# Patient Record
Sex: Female | Born: 1945 | Race: White | Hispanic: No | Marital: Married | Smoking: Former smoker
Health system: Southern US, Community
[De-identification: ages and names within clinical notes are randomized; demographics above are authoritative.]

## PROBLEM LIST (undated history)

## (undated) DIAGNOSIS — K589 Irritable bowel syndrome without diarrhea: Secondary | ICD-10-CM

## (undated) DIAGNOSIS — N289 Disorder of kidney and ureter, unspecified: Secondary | ICD-10-CM

## (undated) DIAGNOSIS — S37009A Unspecified injury of unspecified kidney, initial encounter: Secondary | ICD-10-CM

## (undated) DIAGNOSIS — R51 Headache: Secondary | ICD-10-CM

## (undated) DIAGNOSIS — G709 Myoneural disorder, unspecified: Secondary | ICD-10-CM

## (undated) DIAGNOSIS — E785 Hyperlipidemia, unspecified: Secondary | ICD-10-CM

## (undated) DIAGNOSIS — I38 Endocarditis, valve unspecified: Secondary | ICD-10-CM

## (undated) DIAGNOSIS — R519 Headache, unspecified: Secondary | ICD-10-CM

## (undated) DIAGNOSIS — M1990 Unspecified osteoarthritis, unspecified site: Secondary | ICD-10-CM

## (undated) DIAGNOSIS — I10 Essential (primary) hypertension: Secondary | ICD-10-CM

## (undated) DIAGNOSIS — K219 Gastro-esophageal reflux disease without esophagitis: Secondary | ICD-10-CM

## (undated) DIAGNOSIS — IMO0001 Reserved for inherently not codable concepts without codable children: Secondary | ICD-10-CM

## (undated) HISTORY — PX: CHOLECYSTECTOMY: SHX55

## (undated) HISTORY — DX: Gastro-esophageal reflux disease without esophagitis: K21.9

## (undated) HISTORY — DX: Irritable bowel syndrome without diarrhea: K58.9

## (undated) HISTORY — DX: Hyperlipidemia, unspecified: E78.5

## (undated) HISTORY — DX: Essential (primary) hypertension: I10

## (undated) HISTORY — PX: TUBAL LIGATION: SHX77

---

## 2009-01-05 HISTORY — PX: JOINT REPLACEMENT: SHX530

## 2012-10-20 ENCOUNTER — Ambulatory Visit: Payer: Self-pay | Admitting: Family Medicine

## 2012-10-31 ENCOUNTER — Ambulatory Visit: Payer: Self-pay | Admitting: Family Medicine

## 2014-08-10 ENCOUNTER — Encounter: Payer: Self-pay | Admitting: Gastroenterology

## 2014-08-10 NOTE — Telephone Encounter (Signed)
error 

## 2014-09-12 ENCOUNTER — Other Ambulatory Visit: Payer: Self-pay

## 2014-09-12 DIAGNOSIS — E785 Hyperlipidemia, unspecified: Secondary | ICD-10-CM

## 2014-09-12 DIAGNOSIS — I159 Secondary hypertension, unspecified: Secondary | ICD-10-CM

## 2014-09-12 DIAGNOSIS — K589 Irritable bowel syndrome without diarrhea: Secondary | ICD-10-CM

## 2014-09-12 DIAGNOSIS — I10 Essential (primary) hypertension: Secondary | ICD-10-CM

## 2014-09-12 HISTORY — DX: Essential (primary) hypertension: I10

## 2014-09-12 HISTORY — DX: Hyperlipidemia, unspecified: E78.5

## 2014-09-12 HISTORY — DX: Irritable bowel syndrome, unspecified: K58.9

## 2014-09-13 ENCOUNTER — Ambulatory Visit (INDEPENDENT_AMBULATORY_CARE_PROVIDER_SITE_OTHER): Payer: Medicare Other | Admitting: Gastroenterology

## 2014-09-13 ENCOUNTER — Ambulatory Visit: Payer: Medicare Other | Admitting: Gastroenterology

## 2014-09-13 ENCOUNTER — Encounter: Payer: Self-pay | Admitting: Gastroenterology

## 2014-09-13 ENCOUNTER — Encounter (INDEPENDENT_AMBULATORY_CARE_PROVIDER_SITE_OTHER): Payer: Self-pay

## 2014-09-13 ENCOUNTER — Other Ambulatory Visit: Payer: Self-pay

## 2014-09-13 VITALS — BP 138/83 | HR 65 | Temp 98.0°F | Ht 63.0 in | Wt 190.0 lb

## 2014-09-13 DIAGNOSIS — K589 Irritable bowel syndrome without diarrhea: Secondary | ICD-10-CM | POA: Diagnosis not present

## 2014-09-13 DIAGNOSIS — R195 Other fecal abnormalities: Secondary | ICD-10-CM | POA: Diagnosis not present

## 2014-09-13 NOTE — Progress Notes (Signed)
Gastroenterology Consultation  Referring Provider:     No ref. provider found Primary Care Physician:  [name], MD Primary Gastroenterologist:  Dr. Allen Norris     Reason for Consultation:     Abnormal stool test        HPI:   Kristina Mitchell is a 69 y.o. y/o female referred for consultation & management of abnormal stool test by Dr. Clemmie Krill, [name], MD.  This patient comes in today with a history of a stool tests being positive. The patient's stool test was Cologuard which can be positive for both polyps and cancer. The patient states she had a test because she did not to undergo colonoscopy so she did this stool tests. Now that was positive she realizes that she needs to have a colonoscopy. The patient also reports that she has irritable bowel syndrome. The patient has chronic diarrhea with some abdominal pain and bloating. The patient states her abdominal pain recently for the last 2 months has been in the left upper quadrant when she states she has a bulging after she eats. There is no report of any unexplained weight loss. There is no report of any fevers chills nausea vomiting. The patient also denies any black stools or bloody stools.  Past Medical History  Diagnosis Date  . Hypertension 09/12/2014  . IBS (irritable bowel syndrome) 09/12/2014  . Hyperlipidemia 09/12/2014  . GERD (gastroesophageal reflux disease)     Past Surgical History  Procedure Laterality Date  . Tubal ligation    . Cholecystectomy    . Joint replacement Bilateral     Prior to Admission medications   Medication Sig Start Date End Date Taking? Authorizing Provider  amLODipine (NORVASC) 10 MG tablet  07/31/14  Yes Historical Provider, MD  benazepril (LOTENSIN) 20 MG tablet  09/10/14  Yes Historical Provider, MD  esomeprazole (NEXIUM) 20 MG capsule Take 20 mg by mouth daily at 12 noon.   Yes Historical Provider, MD  imipramine (TOFRANIL) 25 MG tablet Take 25 mg by mouth every evening. 07/10/14  Yes Historical  Provider, MD  nadolol (CORGARD) 40 MG tablet  07/25/14  Yes Historical Provider, MD  simvastatin (ZOCOR) 20 MG tablet  07/06/14  Yes Historical Provider, MD    Family History  Problem Relation Age of Onset  . Heart failure Father   . Stroke Mother   . Cancer Mother     Uterine  . Diverticulitis Brother   . Diverticulitis Father      Social History  Substance Use Topics  . Smoking status: Former Research scientist (life sciences)  . Smokeless tobacco: Never Used  . Alcohol Use: 0.0 oz/week    0 Standard drinks or equivalent per week     Comment: Occasonal    Allergies as of 09/13/2014 - Review Complete 09/13/2014  Allergen Reaction Noted  . Sulfa antibiotics Hives and Swelling 09/12/2014    Review of Systems:    All systems reviewed and negative except where noted in HPI.   Physical Exam:  BP 138/83 mmHg  Pulse 65  Temp(Src) 98 F (36.7 C) (Oral)  Ht 5\' 3"  (1.6 m)  Wt 190 lb (86.183 kg)  BMI 33.67 kg/m2 No LMP recorded. Psych:  Alert and cooperative. Normal mood and affect. General:   Alert,  Well-developed, well-nourished, pleasant and cooperative in NAD Head:  Normocephalic and atraumatic. Eyes:  Sclera clear, no icterus.   Conjunctiva pink. Ears:  Normal auditory acuity. Nose:  No deformity, discharge, or lesions. Mouth:  No  deformity or lesions,oropharynx pink & moist. Neck:  Supple; no masses or thyromegaly. Lungs:  Respirations even and unlabored.  Clear throughout to auscultation.   No wheezes, crackles, or rhonchi. No acute distress. Heart:  Regular rate and rhythm; no murmurs, clicks, rubs, or gallops. Abdomen:  Normal bowel sounds.  No bruits.  Soft, non-tender and non-distended without masses, hepatosplenomegaly or hernias noted.  No guarding or rebound tenderness.  Negative Carnett sign.   Rectal:  Deferred.  Msk:  Symmetrical without gross deformities.  Good, equal movement & strength bilaterally. Pulses:  Normal pulses noted. Extremities:  No clubbing or edema.  No  cyanosis. Neurologic:  Alert and oriented x3;  grossly normal neurologically. Skin:  Intact without significant lesions or rashes.  No jaundice. Lymph Nodes:  No significant cervical adenopathy. Psych:  Alert and cooperative. Normal mood and affect.  Imaging Studies: No results found.  Assessment and Plan:   Kristina Mitchell is a 69 y.o. y/o female who had a positive stool test with Cologuard. The patient will be set up for colonoscopy due to this positive test. The patient is asymptomatic except for her irritable bowel syndrome symptoms including diarrhea and some bloating. The patient has been explained the plan and agrees with it. I have discussed risks & benefits which include, but are not limited to, bleeding, infection, perforation & drug reaction.  The patient agrees with this plan & written consent will be obtained.      Note: This dictation was prepared with Dragon dictation along with smaller phrase technology. Any transcriptional errors that result from this process are unintentional.

## 2014-09-20 NOTE — Discharge Instructions (Signed)

## 2014-09-21 ENCOUNTER — Encounter
Admission: RE | Disposition: A | Discharge status: Home / Self Care | Payer: Self-pay | Source: Ambulatory Visit | Attending: Gastroenterology

## 2014-09-21 ENCOUNTER — Other Ambulatory Visit: Payer: Self-pay | Admitting: Gastroenterology

## 2014-09-21 ENCOUNTER — Ambulatory Visit: Payer: Medicare Other | Admitting: Anesthesiology

## 2014-09-21 ENCOUNTER — Ambulatory Visit
Admission: RE | Admit: 2014-09-21 | Discharge: 2014-09-21 | Disposition: A | Discharge status: Home / Self Care | Payer: Medicare Other | Source: Ambulatory Visit | Attending: Gastroenterology | Admitting: Gastroenterology

## 2014-09-21 DIAGNOSIS — K635 Polyp of colon: Secondary | ICD-10-CM | POA: Diagnosis not present

## 2014-09-21 DIAGNOSIS — K640 First degree hemorrhoids: Secondary | ICD-10-CM | POA: Insufficient documentation

## 2014-09-21 DIAGNOSIS — Z87891 Personal history of nicotine dependence: Secondary | ICD-10-CM | POA: Insufficient documentation

## 2014-09-21 DIAGNOSIS — M479 Spondylosis, unspecified: Secondary | ICD-10-CM | POA: Diagnosis not present

## 2014-09-21 DIAGNOSIS — K589 Irritable bowel syndrome without diarrhea: Secondary | ICD-10-CM | POA: Diagnosis not present

## 2014-09-21 DIAGNOSIS — K219 Gastro-esophageal reflux disease without esophagitis: Secondary | ICD-10-CM | POA: Insufficient documentation

## 2014-09-21 DIAGNOSIS — G709 Myoneural disorder, unspecified: Secondary | ICD-10-CM | POA: Insufficient documentation

## 2014-09-21 DIAGNOSIS — Z9851 Tubal ligation status: Secondary | ICD-10-CM | POA: Diagnosis not present

## 2014-09-21 DIAGNOSIS — I10 Essential (primary) hypertension: Secondary | ICD-10-CM | POA: Insufficient documentation

## 2014-09-21 DIAGNOSIS — D124 Benign neoplasm of descending colon: Secondary | ICD-10-CM | POA: Diagnosis not present

## 2014-09-21 DIAGNOSIS — K5730 Diverticulosis of large intestine without perforation or abscess without bleeding: Secondary | ICD-10-CM | POA: Diagnosis not present

## 2014-09-21 DIAGNOSIS — Z1211 Encounter for screening for malignant neoplasm of colon: Secondary | ICD-10-CM | POA: Insufficient documentation

## 2014-09-21 DIAGNOSIS — D125 Benign neoplasm of sigmoid colon: Secondary | ICD-10-CM | POA: Insufficient documentation

## 2014-09-21 DIAGNOSIS — E785 Hyperlipidemia, unspecified: Secondary | ICD-10-CM | POA: Diagnosis not present

## 2014-09-21 DIAGNOSIS — Z96653 Presence of artificial knee joint, bilateral: Secondary | ICD-10-CM | POA: Diagnosis not present

## 2014-09-21 DIAGNOSIS — Z792 Long term (current) use of antibiotics: Secondary | ICD-10-CM | POA: Diagnosis not present

## 2014-09-21 DIAGNOSIS — Z9049 Acquired absence of other specified parts of digestive tract: Secondary | ICD-10-CM | POA: Insufficient documentation

## 2014-09-21 DIAGNOSIS — D122 Benign neoplasm of ascending colon: Secondary | ICD-10-CM | POA: Diagnosis not present

## 2014-09-21 HISTORY — DX: Unspecified osteoarthritis, unspecified site: M19.90

## 2014-09-21 HISTORY — PX: COLONOSCOPY WITH PROPOFOL: SHX5780

## 2014-09-21 HISTORY — DX: Headache: R51

## 2014-09-21 HISTORY — DX: Unspecified injury of unspecified kidney, initial encounter: S37.009A

## 2014-09-21 HISTORY — DX: Reserved for inherently not codable concepts without codable children: IMO0001

## 2014-09-21 HISTORY — DX: Endocarditis, valve unspecified: I38

## 2014-09-21 HISTORY — DX: Headache, unspecified: R51.9

## 2014-09-21 HISTORY — PX: POLYPECTOMY: SHX5525

## 2014-09-21 HISTORY — DX: Disorder of kidney and ureter, unspecified: N28.9

## 2014-09-21 HISTORY — DX: Myoneural disorder, unspecified: G70.9

## 2014-09-21 SURGERY — COLONOSCOPY WITH PROPOFOL
Anesthesia: Monitor Anesthesia Care | Wound class: Contaminated

## 2014-09-21 MED ORDER — FENTANYL CITRATE (PF) 100 MCG/2ML IJ SOLN: 25.0000 ug | INTRAMUSCULAR | Status: DC | PRN

## 2014-09-21 MED ORDER — DEXAMETHASONE SODIUM PHOSPHATE 4 MG/ML IJ SOLN: 8.0000 mg | Freq: Once | INTRAMUSCULAR | Status: DC | PRN

## 2014-09-21 MED ORDER — LACTATED RINGERS IV SOLN
INTRAVENOUS | Status: DC
  Administered 2014-09-21: 08:00:00 via INTRAVENOUS

## 2014-09-21 MED ORDER — ACETAMINOPHEN 325 MG PO TABS: 325.0000 mg | ORAL_TABLET | ORAL | Status: DC | PRN

## 2014-09-21 MED ORDER — LIDOCAINE HCL (CARDIAC) 20 MG/ML IV SOLN
INTRAVENOUS | Status: DC | PRN
Start: 2014-09-21 — End: 2014-09-21
  Administered 2014-09-21: 30 mg via INTRAVENOUS

## 2014-09-21 MED ORDER — STERILE WATER FOR IRRIGATION IR SOLN
Status: DC | PRN
  Administered 2014-09-21: 09:00:00

## 2014-09-21 MED ORDER — ACETAMINOPHEN 160 MG/5ML PO SOLN: 325.0000 mg | ORAL | Status: DC | PRN

## 2014-09-21 MED ORDER — OXYCODONE HCL 5 MG PO TABS: 5.0000 mg | ORAL_TABLET | Freq: Once | ORAL | Status: DC | PRN

## 2014-09-21 MED ORDER — PROPOFOL 10 MG/ML IV BOLUS
INTRAVENOUS | Status: DC | PRN
Start: 2014-09-21 — End: 2014-09-21
  Administered 2014-09-21 (×2): 40 mg via INTRAVENOUS
  Administered 2014-09-21: 80 mg via INTRAVENOUS
  Administered 2014-09-21: 40 mg via INTRAVENOUS
  Administered 2014-09-21: 20 mg via INTRAVENOUS
  Administered 2014-09-21: 40 mg via INTRAVENOUS

## 2014-09-21 MED ORDER — SODIUM CHLORIDE 0.9 % IV SOLN: INTRAVENOUS | Status: DC

## 2014-09-21 MED ORDER — OXYCODONE HCL 5 MG/5ML PO SOLN: 5.0000 mg | Freq: Once | ORAL | Status: DC | PRN

## 2014-09-21 MED ORDER — LACTATED RINGERS IV SOLN: 500.0000 mL | INTRAVENOUS | Status: DC

## 2014-09-21 SURGICAL SUPPLY — 28 items
CANISTER SUCT 1200ML W/VALVE (MISCELLANEOUS) ×4 IMPLANT
FCP ESCP3.2XJMB 240X2.8X (MISCELLANEOUS)
FORCEPS BIOP RAD 4 LRG CAP 4 (CUTTING FORCEPS) ×4 IMPLANT
FORCEPS BIOP RJ4 240 W/NDL (MISCELLANEOUS)
FORCEPS ESCP3.2XJMB 240X2.8X (MISCELLANEOUS) IMPLANT
GOWN CVR UNV OPN BCK APRN NK (MISCELLANEOUS) ×4 IMPLANT
GOWN ISOL THUMB LOOP REG UNIV (MISCELLANEOUS) ×4
HEMOCLIP INSTINCT (CLIP) IMPLANT
INJECTOR VARIJECT VIN23 (MISCELLANEOUS) IMPLANT
KIT CO2 TUBING (TUBING) IMPLANT
KIT DEFENDO VALVE AND CONN (KITS) IMPLANT
KIT ENDO PROCEDURE OLY (KITS) ×4 IMPLANT
LIGATOR MULTIBAND 6SHOOTER MBL (MISCELLANEOUS) IMPLANT
MARKER SPOT ENDO TATTOO 5ML (MISCELLANEOUS) IMPLANT
PAD GROUND ADULT SPLIT (MISCELLANEOUS) IMPLANT
SNARE SHORT THROW 13M SML OVAL (MISCELLANEOUS) ×4 IMPLANT
SNARE SHORT THROW 30M LRG OVAL (MISCELLANEOUS) IMPLANT
SPOT EX ENDOSCOPIC TATTOO (MISCELLANEOUS)
SUCTION POLY TRAP 4CHAMBER (MISCELLANEOUS) IMPLANT
TRAP SUCTION POLY (MISCELLANEOUS) ×4 IMPLANT
TUBING CONN 6MMX3.1M (TUBING)
TUBING SUCTION CONN 0.25 STRL (TUBING) IMPLANT
UNDERPAD 30X60 958B10 (PK) (MISCELLANEOUS) IMPLANT
VALVE BIOPSY ENDO (VALVE) IMPLANT
VARIJECT INJECTOR VIN23 (MISCELLANEOUS)
WATER AUXILLARY (MISCELLANEOUS) IMPLANT
WATER STERILE IRR 250ML POUR (IV SOLUTION) ×4 IMPLANT
WATER STERILE IRR 500ML POUR (IV SOLUTION) IMPLANT

## 2014-09-21 NOTE — Anesthesia Preprocedure Evaluation (Addendum)
Anesthesia Evaluation  Patient identified by MRN, date of birth, ID band Patient awake    Reviewed: Allergy & Precautions, H&P , NPO status , Patient's Chart, lab work & pertinent test results, reviewed documented beta blocker date and time   Airway Mallampati: II  TM Distance: >3 FB Neck ROM: full    Dental no notable dental hx.    Pulmonary shortness of breath, former smoker,    Pulmonary exam normal breath sounds clear to auscultation       Cardiovascular Exercise Tolerance: Good hypertension, On Home Beta Blockers and On Medications  Rhythm:regular Rate:Normal     Neuro/Psych  Headaches, negative psych ROS   GI/Hepatic Neg liver ROS, GERD  Medicated,  Endo/Other  negative endocrine ROS  Renal/GU CRFRenal disease  negative genitourinary   Musculoskeletal   Abdominal   Peds  Hematology negative hematology ROS (+)   Anesthesia Other Findings   Reproductive/Obstetrics negative OB ROS                            Anesthesia Physical Anesthesia Plan  ASA: III  Anesthesia Plan: MAC   Post-op Pain Management:    Induction:   Airway Management Planned:   Additional Equipment:   Intra-op Plan:   Post-operative Plan:   Informed Consent: I have reviewed the patients History and Physical, chart, labs and discussed the procedure including the risks, benefits and alternatives for the proposed anesthesia with the patient or authorized representative who has indicated his/her understanding and acceptance.     Plan Discussed with: CRNA  Anesthesia Plan Comments:        Anesthesia Quick Evaluation

## 2014-09-21 NOTE — Op Note (Signed)
[facility] Gastroenterology Patient Name: Kristina Mitchell Procedure Date: 09/21/2014 8:17 AM MRN: [id number] Account #: [id number] Date of Birth: 1945-02-05 Admit Type: Outpatient Age: 69 Room: [facility] OR ROOM 01 Gender: Female Note Status: Finalized Procedure:         Colonoscopy Indications:       Screening for colorectal malignant neoplasm Providers:         Lucilla Lame, MD Referring MD:      Reyes Ivan, MD (Referring MD) Medicines:         Propofol per Anesthesia Complications:     No immediate complications. Procedure:         Pre-Anesthesia Assessment:                    - Prior to the procedure, a History and Physical was                     performed, and patient medications and allergies were                     reviewed. The patient's tolerance of previous anesthesia                     was also reviewed. The risks and benefits of the procedure                     and the sedation options and risks were discussed with the                     patient. All questions were answered, and informed consent                     was obtained. Prior Anticoagulants: The patient has taken                     no previous anticoagulant or antiplatelet agents. ASA                     Grade Assessment: II - A patient with mild systemic                     disease. After reviewing the risks and benefits, the                     patient was deemed in satisfactory condition to undergo                     the procedure.                    After obtaining informed consent, the colonoscope was                     passed under direct vision. Throughout the procedure, the                     patient's blood pressure, pulse, and oxygen saturations                     were monitored continuously. The Olympus CF H180AL                     colonoscope (S#: [id number]) was introduced through the anus  and advanced to the the cecum, identified by appendiceal         orifice and ileocecal valve. The colonoscopy was performed                     without difficulty. The patient tolerated the procedure                     well. The quality of the bowel preparation was good. Findings:      The perianal and digital rectal examinations were normal.      A 4 mm polyp was found in the ascending colon. The polyp was sessile.       The polyp was removed with a cold biopsy forceps. Resection and       retrieval were complete.      A 9 mm polyp was found in the descending colon. The polyp was sessile.       The polyp was removed with a cold snare. Resection and retrieval were       complete.      A 3 mm polyp was found in the sigmoid colon. The polyp was sessile. The       polyp was removed with a cold biopsy forceps. Resection and retrieval       were complete.      Many small-mouthed diverticula were found in the sigmoid colon.      Non-bleeding internal hemorrhoids were found during retroflexion. The       hemorrhoids were Grade I (internal hemorrhoids that do not prolapse). Impression:        - One 4 mm polyp in the ascending colon. Resected and                     retrieved.                    - One 9 mm polyp in the descending colon. Resected and                     retrieved.                    - One 3 mm polyp in the sigmoid colon. Resected and                     retrieved.                    - Diverticulosis in the sigmoid colon.                    - Non-bleeding internal hemorrhoids. Recommendation:    - Await pathology results.                    - Repeat colonoscopy in 5 years if polyp adenoma and 10                     years if hyperplastic Procedure Code(s): --- Professional ---                    [phone], Colonoscopy, flexible; with removal of tumor(s),                     polyp(s), or other lesion(s) by snare technique  [id number], 84, Colonoscopy, flexible; with biopsy, single or                     multiple Diagnosis  Code(s): --- Professional ---                    Z12.11, Encounter for screening for malignant neoplasm of                     colon                    D12.2, Benign neoplasm of ascending colon                    D12.4, Benign neoplasm of descending colon                    D12.5, Benign neoplasm of sigmoid colon CPT copyright 2014 American Medical Association. All rights reserved. The codes documented in this report are preliminary and upon coder review may  be revised to meet current compliance requirements. Lucilla Lame, MD 09/21/2014 8:54:14 AM This report has been signed electronically. Number of Addenda: 0 Note Initiated On: 09/21/2014 8:17 AM Scope Withdrawal Time: 0 hours 10 minutes 35 seconds  Total Procedure Duration: 0 hours 17 minutes 39 seconds       [facility]
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
## 2014-09-21 NOTE — Anesthesia Postprocedure Evaluation (Signed)
  Anesthesia Post-op Note  Patient: Kristina Mitchell  Procedure(s) Performed: Procedure(s): COLONOSCOPY WITH PROPOFOL (N/A) POLYPECTOMY  Anesthesia type:MAC  Patient location: PACU  Post pain: Pain level controlled  Post assessment: Post-op Vital signs reviewed, Patient's Cardiovascular Status Stable, Respiratory Function Stable, Patent Airway and No signs of Nausea or vomiting  Post vital signs: Reviewed and stable  Last Vitals:  Filed Vitals:   09/21/14 0900  BP: 102/69  Pulse: 74  Temp:   Resp: 21    Level of consciousness: awake, alert  and patient cooperative  Complications: No apparent anesthesia complications

## 2014-09-21 NOTE — Anesthesia Procedure Notes (Signed)
Procedure Name: MAC Performed by: LEBLANC, MONIQUE Pre-anesthesia Checklist: Patient identified, Emergency Drugs available, Suction available, Patient being monitored and Timeout performed Patient Re-evaluated:Patient Re-evaluated prior to inductionOxygen Delivery Method: Nasal cannula       

## 2014-09-21 NOTE — Transfer of Care (Signed)
Immediate Anesthesia Transfer of Care Note  Patient: Kristina Mitchell  Procedure(s) Performed: Procedure(s): COLONOSCOPY WITH PROPOFOL (N/A) POLYPECTOMY  Patient Location: PACU  Anesthesia Type: MAC  Level of Consciousness: awake, alert  and patient cooperative  Airway and Oxygen Therapy: Patient Spontanous Breathing and Patient connected to supplemental oxygen  Post-op Assessment: Post-op Vital signs reviewed, Patient's Cardiovascular Status Stable, Respiratory Function Stable, Patent Airway and No signs of Nausea or vomiting  Post-op Vital Signs: Reviewed and stable  Complications: No apparent anesthesia complications

## 2014-09-24 ENCOUNTER — Encounter: Payer: Self-pay | Admitting: Gastroenterology

## 2014-09-25 ENCOUNTER — Encounter: Payer: Self-pay | Admitting: Gastroenterology

## 2014-10-06 ENCOUNTER — Emergency Department: Payer: Medicare Other

## 2014-10-06 ENCOUNTER — Encounter: Payer: Self-pay | Admitting: Emergency Medicine

## 2014-10-06 ENCOUNTER — Observation Stay: Payer: Medicare Other

## 2014-10-06 ENCOUNTER — Observation Stay
Admission: EM | Admit: 2014-10-06 | Discharge: 2014-10-07 | Disposition: A | Discharge status: Home / Self Care | Payer: Medicare Other | Attending: Internal Medicine | Admitting: Internal Medicine

## 2014-10-06 DIAGNOSIS — Z9049 Acquired absence of other specified parts of digestive tract: Secondary | ICD-10-CM | POA: Diagnosis not present

## 2014-10-06 DIAGNOSIS — I639 Cerebral infarction, unspecified: Secondary | ICD-10-CM | POA: Diagnosis present

## 2014-10-06 DIAGNOSIS — H5120 Internuclear ophthalmoplegia, unspecified eye: Secondary | ICD-10-CM | POA: Diagnosis present

## 2014-10-06 DIAGNOSIS — F419 Anxiety disorder, unspecified: Secondary | ICD-10-CM | POA: Insufficient documentation

## 2014-10-06 DIAGNOSIS — M4692 Unspecified inflammatory spondylopathy, cervical region: Secondary | ICD-10-CM | POA: Insufficient documentation

## 2014-10-06 DIAGNOSIS — Z87891 Personal history of nicotine dependence: Secondary | ICD-10-CM | POA: Diagnosis not present

## 2014-10-06 DIAGNOSIS — Z79899 Other long term (current) drug therapy: Secondary | ICD-10-CM | POA: Diagnosis not present

## 2014-10-06 DIAGNOSIS — Z86010 Personal history of colonic polyps: Secondary | ICD-10-CM | POA: Diagnosis not present

## 2014-10-06 DIAGNOSIS — E785 Hyperlipidemia, unspecified: Secondary | ICD-10-CM | POA: Diagnosis not present

## 2014-10-06 DIAGNOSIS — G709 Myoneural disorder, unspecified: Secondary | ICD-10-CM | POA: Diagnosis not present

## 2014-10-06 DIAGNOSIS — N289 Disorder of kidney and ureter, unspecified: Secondary | ICD-10-CM | POA: Diagnosis not present

## 2014-10-06 DIAGNOSIS — I6330 Cerebral infarction due to thrombosis of unspecified cerebral artery: Secondary | ICD-10-CM

## 2014-10-06 DIAGNOSIS — Z96653 Presence of artificial knee joint, bilateral: Secondary | ICD-10-CM | POA: Diagnosis not present

## 2014-10-06 DIAGNOSIS — K589 Irritable bowel syndrome without diarrhea: Secondary | ICD-10-CM | POA: Insufficient documentation

## 2014-10-06 DIAGNOSIS — K219 Gastro-esophageal reflux disease without esophagitis: Secondary | ICD-10-CM | POA: Diagnosis not present

## 2014-10-06 DIAGNOSIS — H532 Diplopia: Secondary | ICD-10-CM | POA: Diagnosis not present

## 2014-10-06 DIAGNOSIS — Z882 Allergy status to sulfonamides status: Secondary | ICD-10-CM | POA: Insufficient documentation

## 2014-10-06 DIAGNOSIS — I10 Essential (primary) hypertension: Secondary | ICD-10-CM | POA: Insufficient documentation

## 2014-10-06 DIAGNOSIS — I6521 Occlusion and stenosis of right carotid artery: Secondary | ICD-10-CM | POA: Insufficient documentation

## 2014-10-06 LAB — COMPREHENSIVE METABOLIC PANEL
ALBUMIN: 3.7 g/dL (ref 3.5–5.0)
ALK PHOS: 97 U/L (ref 38–126)
ALT: 17 U/L (ref 14–54)
AST: 22 U/L (ref 15–41)
Anion gap: 6 (ref 5–15)
BILIRUBIN TOTAL: 0.5 mg/dL (ref 0.3–1.2)
BUN: 22 mg/dL — AB (ref 6–20)
CALCIUM: 9.6 mg/dL (ref 8.9–10.3)
CO2: 28 mmol/L (ref 22–32)
CREATININE: 1.29 mg/dL — AB (ref 0.44–1.00)
Chloride: 105 mmol/L (ref 101–111)
GFR calc Af Amer: 47 mL/min — ABNORMAL LOW (ref >60)
GFR, EST NON AFRICAN AMERICAN: 41 mL/min — AB (ref >60)
GLUCOSE: 123 mg/dL — AB (ref 65–99)
Potassium: 4 mmol/L (ref 3.5–5.1)
Sodium: 139 mmol/L (ref 135–145)
TOTAL PROTEIN: 7.2 g/dL (ref 6.5–8.1)

## 2014-10-06 LAB — CBC WITH DIFFERENTIAL/PLATELET
Basophils Absolute: 0.2 10*3/uL — ABNORMAL HIGH (ref 0–0.1)
Basophils Relative: 2 %
Eosinophils Absolute: 0.2 10*3/uL (ref 0–0.7)
Eosinophils Relative: 3 %
HEMATOCRIT: 46.2 % (ref 35.0–47.0)
HEMOGLOBIN: 15.1 g/dL (ref 12.0–16.0)
LYMPHS PCT: 15 %
Lymphs Abs: 1.2 10*3/uL (ref 1.0–3.6)
MCH: 29.1 pg (ref 26.0–34.0)
MCHC: 32.7 g/dL (ref 32.0–36.0)
MCV: 89 fL (ref 80.0–100.0)
Monocytes Absolute: 0.7 10*3/uL (ref 0.2–0.9)
Monocytes Relative: 9 %
NEUTROS ABS: 5.7 10*3/uL (ref 1.4–6.5)
NEUTROS PCT: 71 %
Platelets: 230 10*3/uL (ref 150–440)
RBC: 5.19 MIL/uL (ref 3.80–5.20)
RDW: 14.4 % (ref 11.5–14.5)
WBC: 8 10*3/uL (ref 3.6–11.0)

## 2014-10-06 MED ORDER — GADOBENATE DIMEGLUMINE 529 MG/ML IV SOLN
20.0000 mL | Freq: Once | INTRAVENOUS | Status: AC | PRN
  Administered 2014-10-06: 17 mL via INTRAVENOUS

## 2014-10-06 MED ORDER — SIMVASTATIN 20 MG PO TABS
20.0000 mg | ORAL_TABLET | Freq: Every day | ORAL | Status: DC
  Administered 2014-10-06: 20 mg via ORAL
  Filled 2014-10-06: qty 1

## 2014-10-06 MED ORDER — ACETAMINOPHEN 325 MG PO TABS: 650.0000 mg | ORAL_TABLET | Freq: Four times a day (QID) | ORAL | Status: DC | PRN

## 2014-10-06 MED ORDER — ENOXAPARIN SODIUM 40 MG/0.4ML ~~LOC~~ SOLN
40.0000 mg | SUBCUTANEOUS | Status: DC
  Administered 2014-10-06: 40 mg via SUBCUTANEOUS
  Filled 2014-10-06: qty 0.4

## 2014-10-06 MED ORDER — ASPIRIN 81 MG PO CHEW
324.0000 mg | CHEWABLE_TABLET | Freq: Once | ORAL | Status: AC
  Administered 2014-10-06: 324 mg via ORAL
  Filled 2014-10-06: qty 4

## 2014-10-06 MED ORDER — PANTOPRAZOLE SODIUM 40 MG PO TBEC
40.0000 mg | DELAYED_RELEASE_TABLET | Freq: Every day | ORAL | Status: DC
  Administered 2014-10-07: 40 mg via ORAL
  Filled 2014-10-06: qty 1

## 2014-10-06 MED ORDER — SODIUM CHLORIDE 0.9 % IJ SOLN
3.0000 mL | Freq: Two times a day (BID) | INTRAMUSCULAR | Status: DC
  Administered 2014-10-06 – 2014-10-07 (×2): 3 mL via INTRAVENOUS

## 2014-10-06 MED ORDER — BENAZEPRIL HCL 20 MG PO TABS
20.0000 mg | ORAL_TABLET | Freq: Every day | ORAL | Status: DC
  Administered 2014-10-07: 20 mg via ORAL
  Filled 2014-10-06: qty 1

## 2014-10-06 MED ORDER — AMLODIPINE BESYLATE 10 MG PO TABS
10.0000 mg | ORAL_TABLET | Freq: Every day | ORAL | Status: DC
  Administered 2014-10-07: 10 mg via ORAL
  Filled 2014-10-06: qty 1

## 2014-10-06 MED ORDER — IMIPRAMINE HCL 25 MG PO TABS
25.0000 mg | ORAL_TABLET | Freq: Every evening | ORAL | Status: DC
  Administered 2014-10-06: 25 mg via ORAL
  Filled 2014-10-06 (×2): qty 1

## 2014-10-06 MED ORDER — NADOLOL 40 MG PO TABS
40.0000 mg | ORAL_TABLET | Freq: Two times a day (BID) | ORAL | Status: DC
  Administered 2014-10-06 – 2014-10-07 (×2): 40 mg via ORAL
  Filled 2014-10-06 (×2): qty 1

## 2014-10-06 MED ORDER — ASPIRIN EC 81 MG PO TBEC
81.0000 mg | DELAYED_RELEASE_TABLET | Freq: Every day | ORAL | Status: DC
  Administered 2014-10-07: 81 mg via ORAL
  Filled 2014-10-06: qty 1

## 2014-10-06 MED ORDER — ACETAMINOPHEN 650 MG RE SUPP: 650.0000 mg | Freq: Four times a day (QID) | RECTAL | Status: DC | PRN

## 2014-10-06 NOTE — ED Notes (Signed)
MD at bedside. 

## 2014-10-06 NOTE — ED Notes (Signed)
Returned from ultrasound.

## 2014-10-06 NOTE — ED Notes (Signed)
Patient transported to Ultrasound 

## 2014-10-06 NOTE — Progress Notes (Addendum)
Patient ID: Kristina Mitchell, female   DOB: 1945/06/05, 69 y.o.   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 I discussed these findings with the patient. The patient states that her vision is getting better. I advised that she must continue the aspirin on a daily basis even upon discharge home. We'll check a lipid profile in the a.m. and consider increasing the statin to high-dose statin.  ER physician check pressure in the eyes 28 on the right eye 20 left eye. Will need ophthalmology follow-up as outpatient.  Dr. Loletha Grayer

## 2014-10-06 NOTE — Progress Notes (Signed)
Skin intact. Pt oriented to room, dining service.
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
## 2014-10-06 NOTE — Progress Notes (Signed)
69 y/o lady enters with diplopia which started at 2130 last evening. States sx somewhat improved. Denies nausea or dizziness

## 2014-10-06 NOTE — ED Notes (Signed)
Patient transported to MRI 

## 2014-10-06 NOTE — ED Notes (Signed)
Pt arrived via EMS from home with husband.  Pt states double vision started last night around 2130. She states she has had double vision before, but usually goes away after 5 minutes or so. Patient also states she was recently seen at eye doctor for routine visit. Pt alert and oriented x4. NAD.
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
## 2014-10-07 ENCOUNTER — Observation Stay
Admit: 2014-10-07 | Discharge: 2014-10-07 | Disposition: A | Discharge status: Home / Self Care | Payer: Medicare Other | Attending: Internal Medicine | Admitting: Internal Medicine

## 2014-10-07 DIAGNOSIS — I639 Cerebral infarction, unspecified: Secondary | ICD-10-CM | POA: Diagnosis not present

## 2014-10-07 DIAGNOSIS — I6330 Cerebral infarction due to thrombosis of unspecified cerebral artery: Secondary | ICD-10-CM

## 2014-10-07 LAB — CBC
HCT: 40.3 % (ref 35.0–47.0)
HEMOGLOBIN: 13.6 g/dL (ref 12.0–16.0)
MCH: 30 pg (ref 26.0–34.0)
MCHC: 33.7 g/dL (ref 32.0–36.0)
MCV: 89 fL (ref 80.0–100.0)
PLATELETS: 198 10*3/uL (ref 150–440)
RBC: 4.53 MIL/uL (ref 3.80–5.20)
RDW: 14.4 % (ref 11.5–14.5)
WBC: 7.5 10*3/uL (ref 3.6–11.0)

## 2014-10-07 LAB — BASIC METABOLIC PANEL
ANION GAP: 6 (ref 5–15)
BUN: 25 mg/dL — ABNORMAL HIGH (ref 6–20)
CALCIUM: 9.3 mg/dL (ref 8.9–10.3)
CO2: 26 mmol/L (ref 22–32)
CREATININE: 1.24 mg/dL — AB (ref 0.44–1.00)
Chloride: 108 mmol/L (ref 101–111)
GFR, EST AFRICAN AMERICAN: 49 mL/min — AB (ref >60)
GFR, EST NON AFRICAN AMERICAN: 43 mL/min — AB (ref >60)
GLUCOSE: 96 mg/dL (ref 65–99)
Potassium: 4.1 mmol/L (ref 3.5–5.1)
Sodium: 140 mmol/L (ref 135–145)

## 2014-10-07 LAB — LIPID PANEL
Cholesterol: 185 mg/dL (ref 0–200)
HDL: 51 mg/dL (ref >40)
LDL Cholesterol: 108 mg/dL — ABNORMAL HIGH (ref 0–99)
TRIGLYCERIDES: 130 mg/dL (ref <150)
Total CHOL/HDL Ratio: 3.6 RATIO
VLDL: 26 mg/dL (ref 0–40)

## 2014-10-07 MED ORDER — ASPIRIN 81 MG PO TBEC: 81.0000 mg | DELAYED_RELEASE_TABLET | Freq: Every day | ORAL | Status: DC
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
## 2014-10-07 NOTE — Progress Notes (Signed)
*  PRELIMINARY RESULTS* Echocardiogram 2D Echocardiogram has been performed.  Kristina Mitchell 10/07/2014, 7:49 AM

## 2014-10-07 NOTE — Progress Notes (Signed)
Patient d/c'd home. Education provided, no questions at this time. Patient to be picked up by husband. Telemetry removed. Kristina Mitchell  

## 2014-10-07 NOTE — Discharge Instructions (Signed)
See your eye doctor next week. Please call and make appt for next week

## 2015-02-05 DIAGNOSIS — G8929 Other chronic pain: Secondary | ICD-10-CM | POA: Insufficient documentation

## 2015-02-05 DIAGNOSIS — R109 Unspecified abdominal pain: Secondary | ICD-10-CM

## 2015-06-10 ENCOUNTER — Other Ambulatory Visit: Payer: Self-pay | Admitting: Family Medicine

## 2015-06-10 DIAGNOSIS — Z1231 Encounter for screening mammogram for malignant neoplasm of breast: Secondary | ICD-10-CM

## 2015-08-06 ENCOUNTER — Other Ambulatory Visit: Payer: Self-pay | Admitting: Nephrology

## 2015-08-06 DIAGNOSIS — N281 Cyst of kidney, acquired: Secondary | ICD-10-CM

## 2015-08-16 ENCOUNTER — Other Ambulatory Visit: Payer: Medicare Other

## 2015-08-16 ENCOUNTER — Ambulatory Visit: Payer: Medicare Other

## 2015-08-17 ENCOUNTER — Ambulatory Visit: Payer: Medicare Other

## 2015-08-17 ENCOUNTER — Ambulatory Visit
Admission: RE | Admit: 2015-08-17 | Discharge: 2015-08-17 | Disposition: A | Discharge status: Home / Self Care | Payer: Medicare Other | Source: Ambulatory Visit | Attending: Nephrology | Admitting: Nephrology

## 2015-08-17 ENCOUNTER — Ambulatory Visit: Admission: RE | Admit: 2015-08-17 | Payer: Medicare Other | Source: Ambulatory Visit

## 2015-08-17 DIAGNOSIS — K760 Fatty (change of) liver, not elsewhere classified: Secondary | ICD-10-CM | POA: Diagnosis not present

## 2015-08-17 DIAGNOSIS — N838 Other noninflammatory disorders of ovary, fallopian tube and broad ligament: Secondary | ICD-10-CM | POA: Insufficient documentation

## 2015-08-17 DIAGNOSIS — N281 Cyst of kidney, acquired: Secondary | ICD-10-CM | POA: Diagnosis present

## 2015-08-26 ENCOUNTER — Encounter: Payer: Self-pay | Admitting: Urology

## 2015-08-26 ENCOUNTER — Telehealth: Payer: Self-pay

## 2015-08-26 ENCOUNTER — Ambulatory Visit (INDEPENDENT_AMBULATORY_CARE_PROVIDER_SITE_OTHER): Payer: Medicare Other | Admitting: Urology

## 2015-08-26 VITALS — BP 143/83 | HR 67 | Ht 63.0 in | Wt 190.8 lb

## 2015-08-26 DIAGNOSIS — N281 Cyst of kidney, acquired: Secondary | ICD-10-CM

## 2015-08-26 DIAGNOSIS — Q6100 Congenital renal cyst, unspecified: Secondary | ICD-10-CM | POA: Diagnosis not present

## 2015-08-26 LAB — URINALYSIS, ROUTINE W REFLEX MICROSCOPIC
Bilirubin, UA: NEGATIVE
Glucose, UA: NEGATIVE
Ketones, UA: NEGATIVE
Leukocytes, UA: NEGATIVE
NITRITE UA: NEGATIVE
PH UA: 7 (ref 5.0–7.5)
Protein, UA: NEGATIVE
RBC, UA: NEGATIVE
Specific Gravity, UA: 1.02 (ref 1.005–1.030)
UUROB: 0.2 mg/dL (ref 0.2–1.0)

## 2015-08-26 LAB — MICROSCOPIC EXAMINATION
BACTERIA UA: NONE SEEN
RBC MICROSCOPIC, UA: NONE SEEN /HPF (ref 0–?)

## 2015-08-26 NOTE — Telephone Encounter (Signed)
-----   Message from Festus Aloe, MD sent at 08/26/2015 12:07 PM EDT ----- Could you let the patient know there was a cyst in her ovary on the MRI of the abdomen and radiology recommended a pelvic ultrasound. It is not related to Urology or her kidney cysts, I just want to make sure someone told her about it. She should see her PCP or Gyn to get this set up. Thanks.

## 2015-08-26 NOTE — Telephone Encounter (Signed)
Spoke with pt in reference to MRI results and cyst on ovary. Pt voiced understanding.
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
## 2015-09-17 ENCOUNTER — Other Ambulatory Visit: Payer: Self-pay | Admitting: Family Medicine

## 2015-09-17 DIAGNOSIS — R935 Abnormal findings on diagnostic imaging of other abdominal regions, including retroperitoneum: Secondary | ICD-10-CM

## 2015-09-23 ENCOUNTER — Ambulatory Visit: Payer: Medicare Other

## 2016-08-10 ENCOUNTER — Ambulatory Visit
Admission: RE | Admit: 2016-08-10 | Discharge: 2016-08-10 | Disposition: A | Discharge status: Home / Self Care | Payer: Medicare Other | Source: Ambulatory Visit | Attending: Urology | Admitting: Urology

## 2016-08-10 DIAGNOSIS — R93421 Abnormal radiologic findings on diagnostic imaging of right kidney: Secondary | ICD-10-CM | POA: Diagnosis not present

## 2016-08-10 DIAGNOSIS — N281 Cyst of kidney, acquired: Secondary | ICD-10-CM | POA: Insufficient documentation

## 2016-08-13 ENCOUNTER — Telehealth: Payer: Self-pay

## 2016-08-13 NOTE — Telephone Encounter (Signed)
-----   Message from Festus Aloe, MD sent at 08/13/2016  7:54 AM EDT ----- Notify patient renal US continues to show cysts in the right kidney with no worrisome features (these cysts appear benign). F/u as planned.    ----- Message ----- From: Royanne Foots, CMA Sent: 08/10/2016   3:21 PM To: Festus Aloe, MD    ----- Message ----- From: Interface, Rad Results In Sent: 08/10/2016   2:47 PM To: Rowe Robert Clinical

## 2016-08-13 NOTE — Telephone Encounter (Signed)
Patient notified

## 2016-08-25 ENCOUNTER — Encounter: Payer: Self-pay | Admitting: Urology

## 2016-08-25 ENCOUNTER — Ambulatory Visit (INDEPENDENT_AMBULATORY_CARE_PROVIDER_SITE_OTHER): Payer: Medicare Other | Admitting: Urology

## 2016-08-25 VITALS — BP 106/68 | HR 67 | Ht 63.0 in | Wt 184.0 lb

## 2016-08-25 DIAGNOSIS — N281 Cyst of kidney, acquired: Secondary | ICD-10-CM
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
## 2016-09-08 ENCOUNTER — Other Ambulatory Visit: Payer: Self-pay | Admitting: Family Medicine

## 2016-09-08 DIAGNOSIS — Z1231 Encounter for screening mammogram for malignant neoplasm of breast: Secondary | ICD-10-CM

## 2016-09-14 ENCOUNTER — Ambulatory Visit
Admission: RE | Admit: 2016-09-14 | Discharge: 2016-09-14 | Disposition: A | Discharge status: Home / Self Care | Payer: Medicare Other | Source: Ambulatory Visit | Attending: Family Medicine | Admitting: Family Medicine

## 2016-09-14 DIAGNOSIS — Z1231 Encounter for screening mammogram for malignant neoplasm of breast: Secondary | ICD-10-CM | POA: Diagnosis present

## 2017-06-13 IMAGING — MR MR ABDOMEN W/O CM
7 series · 33 of 48 positions shown · non-contrast
Comparison: None available at the time of this dictation.

CLINICAL DATA: 69-year-old female with a reported history of
indeterminate renal cysts on recent outside CT study. History of
cholecystectomy.

EXAM:
MRI ABDOMEN WITHOUT CONTRAST
TECHNIQUE: Multiplanar multisequence MR imaging was performed without the
administration of intravenous contrast.

[Series 2: T2 · coronal · 8.0mm · 0.78mm/px · 4 of 19 slices shown]
[im 1/19]
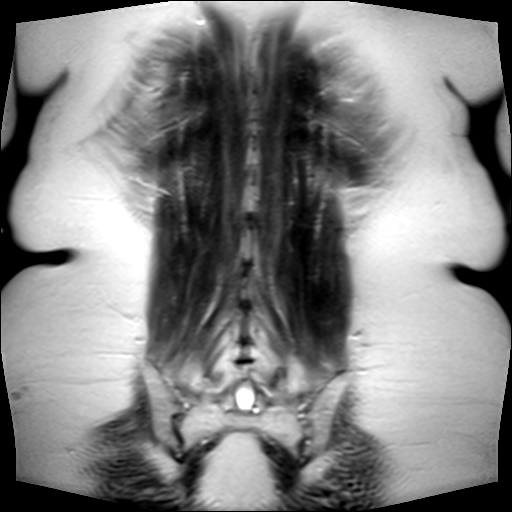
[im 7/19]
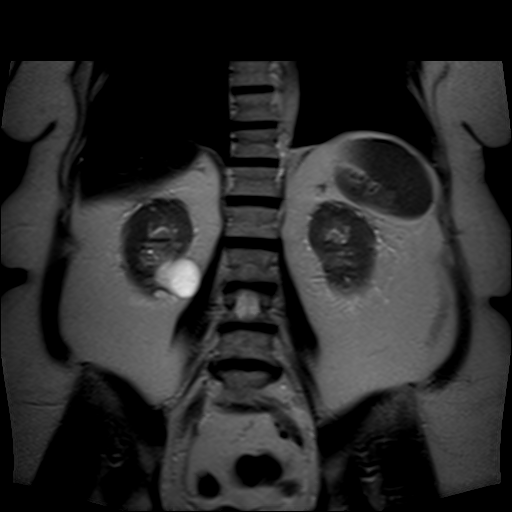
[im 13/19]
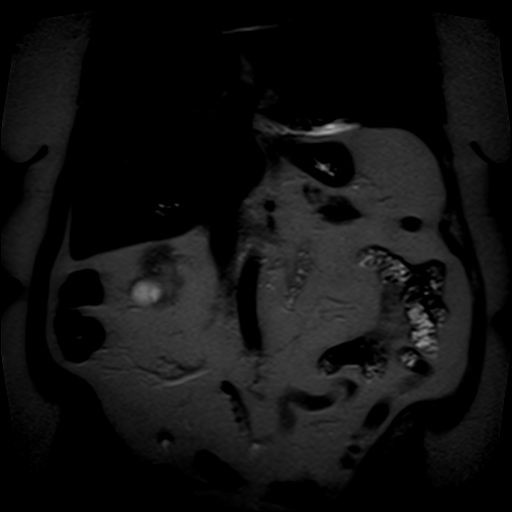
[im 19/19]
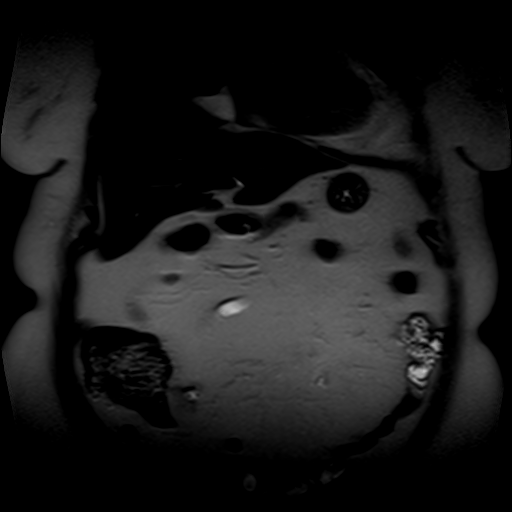

[Series 3: T2 fat-sat · axial · 7.0mm · 0.74mm/px · z∈[-87,+64]mm · 3 of 19 slices shown]
[im 1/19]
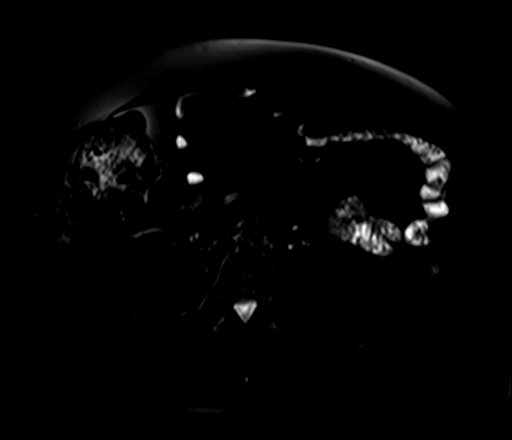
[im 10/19]
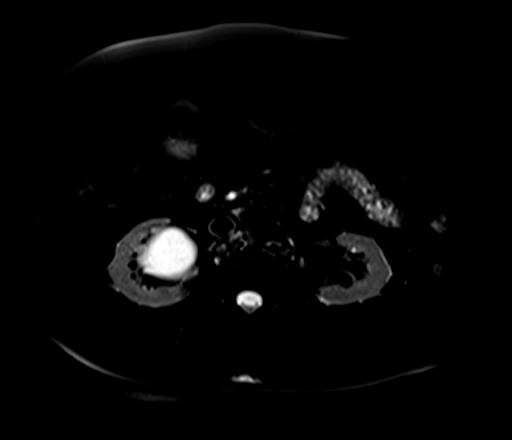
[im 19/19]
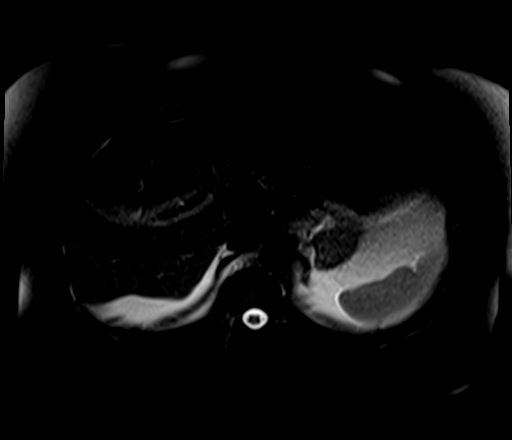

[Series 4: T1 · axial · 7.0mm · 0.74mm/px · z∈[-87,+64]mm · 6 of 38 slices shown]
[im 1/38]
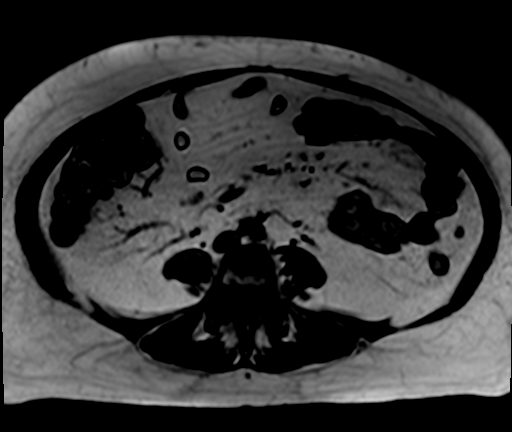
[im 8/38]
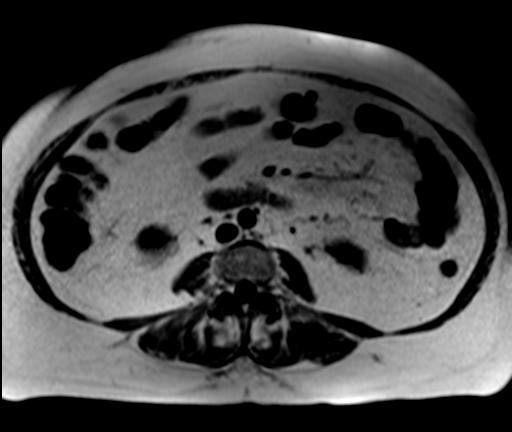
[im 15/38]
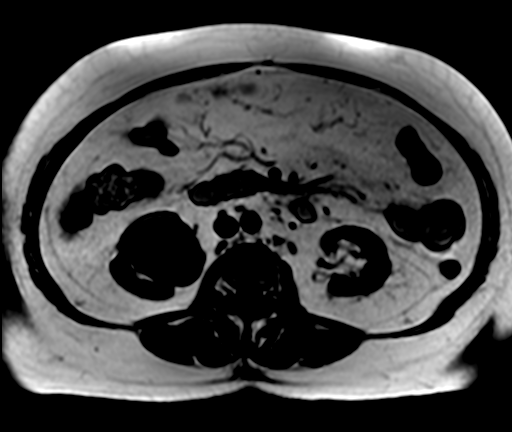
[im 23/38]
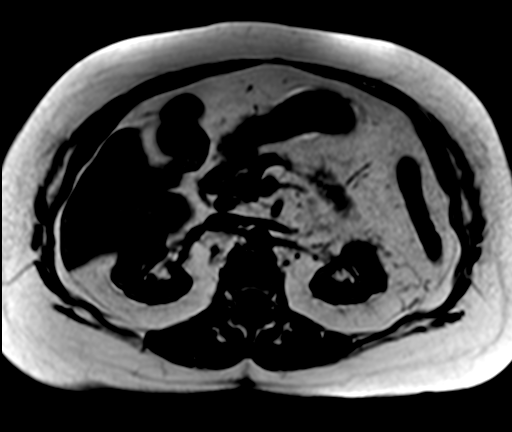
[im 30/38]
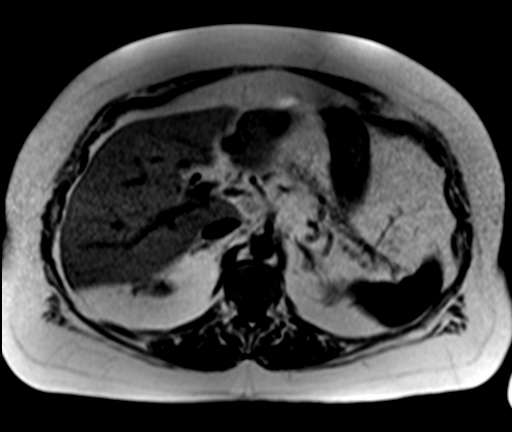
[im 38/38]
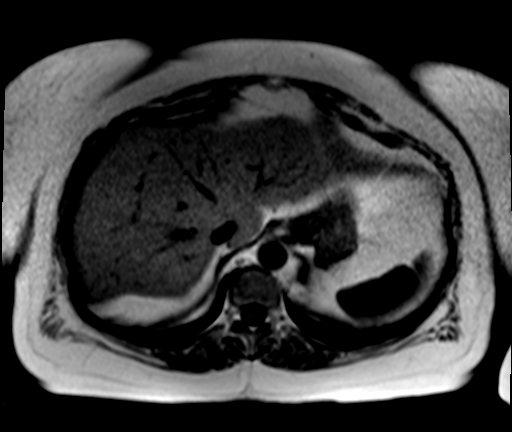

[Series 6: DWI · axial · 6.0mm · 1.98mm/px · z∈[-117,+92]mm · 8 of 90 slices shown]
[im 1/90]
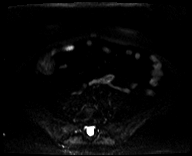
[im 14/90]
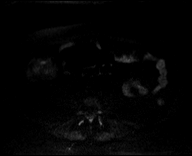
[im 28/90]
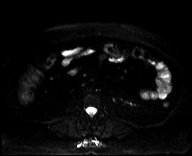
[im 42/90]
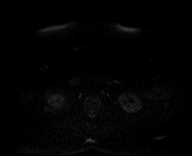
[im 48/90]
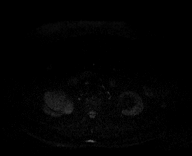
[im 62/90]
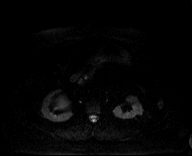
[im 76/90]
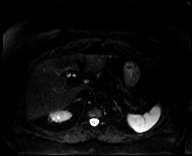
[im 90/90]
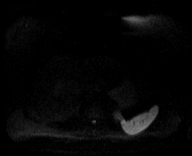

[Series 7: ax dwi_adc · axial · 6.0mm · 1.98mm/px · z∈[-117,+92]mm · 5 of 30 slices shown]
[im 1/30]
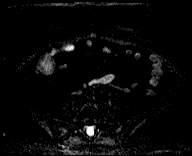
[im 8/30]
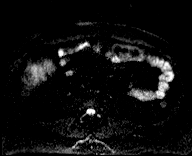
[im 15/30]
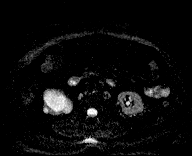
[im 22/30]
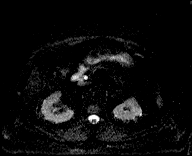
[im 30/30]
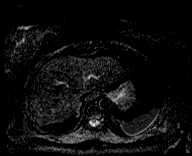

[Series 8: bSSFP · axial · 4.0mm · 0.74mm/px · z∈[-91,+61]mm · 6 of 39 slices shown]
[im 1/39]
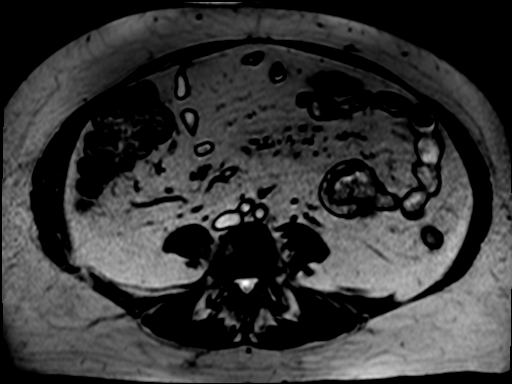
[im 8/39]
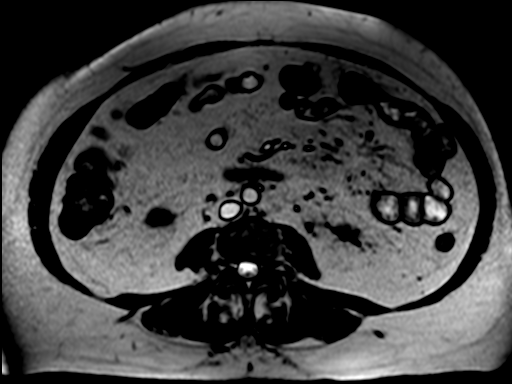
[im 16/39]
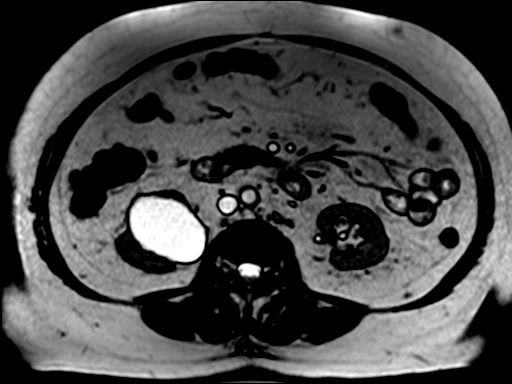
[im 23/39]
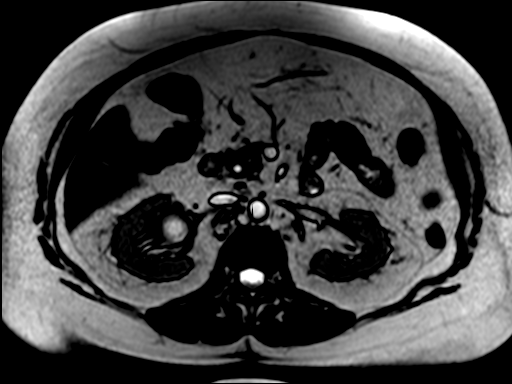
[im 31/39]
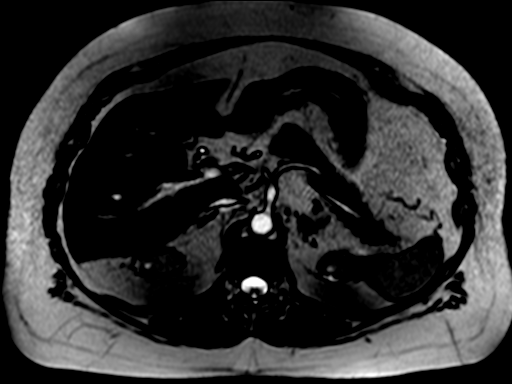
[im 39/39]
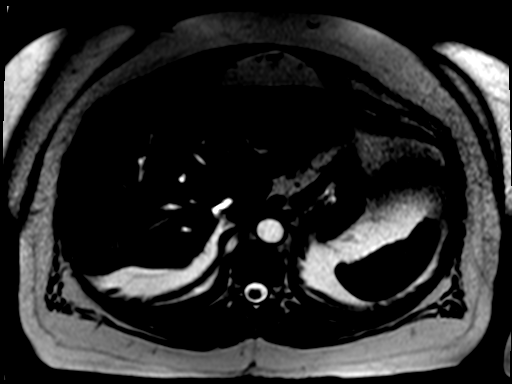

[Series 9: T1 dynamic fat-sat · axial · non-contrast · 2.5mm · 0.74mm/px · 1 of 64 slices shown]
[im 1/64]
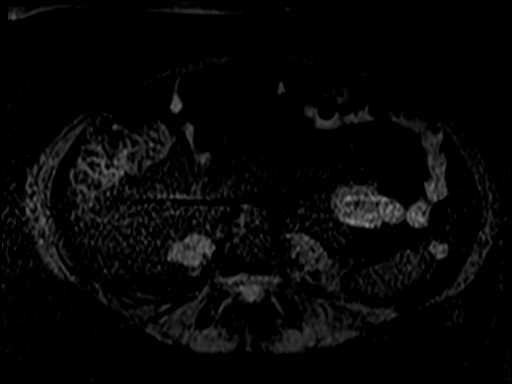

[33 of 48 positions shown; findings below may reference images not displayed]

FINDINGS: Lower chest: Clear lung bases.

Hepatobiliary: Normal liver size and configuration. Moderate diffuse
hepatic steatosis . No liver mass on limited views of the liver.
Cholecystectomy. No biliary ductal dilatation. Common bile duct
diameter 5 mm. No choledocholithiasis.

Pancreas: No pancreatic mass or duct dilation.  No pancreas divisum.

Spleen: Normal size. No mass.

Adrenals/Urinary Tract: Normal adrenals. No hydronephrosis. There is
a 6.7 cm renal cyst in the lower right kidney with mildly lobulated
contour and no mural nodular components or thickened internal
septations. A few additional subcentimeter simple appearing renal
cysts are present in both kidneys. No T2 hypointense renal masses.

Stomach/Bowel: Grossly normal stomach. Visualized small and large
bowel is normal caliber, with no bowel wall thickening.

Vascular/Lymphatic: Normal caliber abdominal aorta. A 1.2 cm
portacaval node (series 4/ image 27) is within normal limits for
this nodal station. No pathologically enlarged lymph nodes in the
abdomen.

Other: No abdominal ascites or focal fluid collection. Limited
visualization of a simple appearing 1.0 cm right adnexal cyst
(series 2/image 10).

Musculoskeletal: No aggressive appearing focal osseous lesions.
IMPRESSION: 1. Bilateral renal cysts without aggressive features on this
noncontrast MRI, largest 6.7 cm in the lower right kidney.
2. Moderate diffuse hepatic steatosis.
3. Limited visualization of an incidental simple appearing 1.0 cm
right adnexal cyst. Recommend further evaluation with transabdominal
and transvaginal pelvic ultrasound.

## 2021-04-05 ENCOUNTER — Other Ambulatory Visit: Payer: Self-pay

## 2021-04-05 ENCOUNTER — Inpatient Hospital Stay
Admission: EM | Admit: 2021-04-05 | Discharge: 2021-04-08 | DRG: 683 | Disposition: A | Discharge status: Home / Self Care | Payer: Medicare Other | Attending: Internal Medicine | Admitting: Internal Medicine

## 2021-04-05 ENCOUNTER — Encounter: Payer: Self-pay | Admitting: Emergency Medicine

## 2021-04-05 ENCOUNTER — Emergency Department: Payer: Medicare Other

## 2021-04-05 DIAGNOSIS — K219 Gastro-esophageal reflux disease without esophagitis: Secondary | ICD-10-CM | POA: Diagnosis present

## 2021-04-05 DIAGNOSIS — E872 Acidosis, unspecified: Secondary | ICD-10-CM | POA: Diagnosis present

## 2021-04-05 DIAGNOSIS — D631 Anemia in chronic kidney disease: Secondary | ICD-10-CM | POA: Diagnosis present

## 2021-04-05 DIAGNOSIS — N183 Chronic kidney disease, stage 3 unspecified: Secondary | ICD-10-CM | POA: Diagnosis not present

## 2021-04-05 DIAGNOSIS — I129 Hypertensive chronic kidney disease with stage 1 through stage 4 chronic kidney disease, or unspecified chronic kidney disease: Secondary | ICD-10-CM | POA: Diagnosis present

## 2021-04-05 DIAGNOSIS — N184 Chronic kidney disease, stage 4 (severe): Secondary | ICD-10-CM | POA: Diagnosis present

## 2021-04-05 DIAGNOSIS — N179 Acute kidney failure, unspecified: Principal | ICD-10-CM | POA: Diagnosis present

## 2021-04-05 DIAGNOSIS — Z8249 Family history of ischemic heart disease and other diseases of the circulatory system: Secondary | ICD-10-CM | POA: Diagnosis not present

## 2021-04-05 DIAGNOSIS — Z823 Family history of stroke: Secondary | ICD-10-CM

## 2021-04-05 DIAGNOSIS — Z809 Family history of malignant neoplasm, unspecified: Secondary | ICD-10-CM

## 2021-04-05 DIAGNOSIS — Z87891 Personal history of nicotine dependence: Secondary | ICD-10-CM | POA: Diagnosis not present

## 2021-04-05 DIAGNOSIS — I159 Secondary hypertension, unspecified: Secondary | ICD-10-CM | POA: Diagnosis not present

## 2021-04-05 DIAGNOSIS — N2581 Secondary hyperparathyroidism of renal origin: Secondary | ICD-10-CM | POA: Diagnosis present

## 2021-04-05 DIAGNOSIS — E785 Hyperlipidemia, unspecified: Secondary | ICD-10-CM | POA: Diagnosis present

## 2021-04-05 DIAGNOSIS — I10 Essential (primary) hypertension: Secondary | ICD-10-CM | POA: Diagnosis present

## 2021-04-05 DIAGNOSIS — K589 Irritable bowel syndrome without diarrhea: Secondary | ICD-10-CM | POA: Diagnosis present

## 2021-04-05 DIAGNOSIS — Z7982 Long term (current) use of aspirin: Secondary | ICD-10-CM | POA: Diagnosis not present

## 2021-04-05 DIAGNOSIS — N189 Chronic kidney disease, unspecified: Secondary | ICD-10-CM | POA: Diagnosis not present

## 2021-04-05 LAB — CBC
HCT: 34.5 % — ABNORMAL LOW (ref 36.0–46.0)
Hemoglobin: 10.9 g/dL — ABNORMAL LOW (ref 12.0–15.0)
MCH: 30.2 pg (ref 26.0–34.0)
MCHC: 31.6 g/dL (ref 30.0–36.0)
MCV: 95.6 fL (ref 80.0–100.0)
Platelets: 251 10*3/uL (ref 150–400)
RBC: 3.61 MIL/uL — ABNORMAL LOW (ref 3.87–5.11)
RDW: 12.8 % (ref 11.5–15.5)
WBC: 7.9 10*3/uL (ref 4.0–10.5)
nRBC: 0 % (ref 0.0–0.2)

## 2021-04-05 LAB — URINALYSIS, COMPLETE (UACMP) WITH MICROSCOPIC
Bacteria, UA: NONE SEEN
Bilirubin Urine: NEGATIVE
Glucose, UA: NEGATIVE mg/dL
Hgb urine dipstick: NEGATIVE
Ketones, ur: NEGATIVE mg/dL
Nitrite: NEGATIVE
Protein, ur: NEGATIVE mg/dL
Specific Gravity, Urine: 1.009 (ref 1.005–1.030)
pH: 5 (ref 5.0–8.0)

## 2021-04-05 LAB — COMPREHENSIVE METABOLIC PANEL
ALT: 15 U/L (ref 0–44)
AST: 22 U/L (ref 15–41)
Albumin: 3.7 g/dL (ref 3.5–5.0)
Alkaline Phosphatase: 68 U/L (ref 38–126)
Anion gap: 9 (ref 5–15)
BUN: 51 mg/dL — ABNORMAL HIGH (ref 8–23)
CO2: 21 mmol/L — ABNORMAL LOW (ref 22–32)
Calcium: 9.8 mg/dL (ref 8.9–10.3)
Chloride: 111 mmol/L (ref 98–111)
Creatinine, Ser: 3.79 mg/dL — ABNORMAL HIGH (ref 0.44–1.00)
GFR, Estimated: 12 mL/min — ABNORMAL LOW (ref >60)
Glucose, Bld: 118 mg/dL — ABNORMAL HIGH (ref 70–99)
Potassium: 4.8 mmol/L (ref 3.5–5.1)
Sodium: 141 mmol/L (ref 135–145)
Total Bilirubin: 0.5 mg/dL (ref 0.3–1.2)
Total Protein: 6.9 g/dL (ref 6.5–8.1)

## 2021-04-05 LAB — CK: Total CK: 77 U/L (ref 38–234)

## 2021-04-05 MED ORDER — ASPIRIN EC 81 MG PO TBEC
81.0000 mg | DELAYED_RELEASE_TABLET | Freq: Every day | ORAL | Status: DC
  Administered 2021-04-06 – 2021-04-08 (×3): 81 mg via ORAL
  Filled 2021-04-05 (×3): qty 1

## 2021-04-05 MED ORDER — NADOLOL 40 MG PO TABS
40.0000 mg | ORAL_TABLET | Freq: Two times a day (BID) | ORAL | Status: DC
  Administered 2021-04-05 – 2021-04-08 (×5): 40 mg via ORAL
  Filled 2021-04-05 (×7): qty 1

## 2021-04-05 MED ORDER — ONDANSETRON HCL 4 MG PO TABS: 4.0000 mg | ORAL_TABLET | Freq: Four times a day (QID) | ORAL | Status: DC | PRN

## 2021-04-05 MED ORDER — ONDANSETRON HCL 4 MG/2ML IJ SOLN: 4.0000 mg | Freq: Four times a day (QID) | INTRAMUSCULAR | Status: DC | PRN

## 2021-04-05 MED ORDER — ROSUVASTATIN CALCIUM 10 MG PO TABS
10.0000 mg | ORAL_TABLET | Freq: Every day | ORAL | Status: DC
  Administered 2021-04-05 – 2021-04-07 (×3): 10 mg via ORAL
  Filled 2021-04-05 (×3): qty 1

## 2021-04-05 MED ORDER — SODIUM CHLORIDE 0.9 % IV SOLN: INTRAVENOUS | Status: DC

## 2021-04-05 MED ORDER — HEPARIN SODIUM (PORCINE) 5000 UNIT/ML IJ SOLN
5000.0000 [IU] | Freq: Three times a day (TID) | INTRAMUSCULAR | Status: DC
  Administered 2021-04-06: 5000 [IU] via SUBCUTANEOUS
  Filled 2021-04-05 (×5): qty 1

## 2021-04-05 MED ORDER — AMLODIPINE BESYLATE 10 MG PO TABS
10.0000 mg | ORAL_TABLET | Freq: Every day | ORAL | Status: DC
  Administered 2021-04-06 – 2021-04-08 (×3): 10 mg via ORAL
  Filled 2021-04-05 (×3): qty 1

## 2021-04-05 MED ORDER — ROSUVASTATIN CALCIUM 10 MG PO TABS: 10.0000 mg | ORAL_TABLET | Freq: Every day | ORAL | Status: DC

## 2021-04-05 MED ORDER — ACETAMINOPHEN 325 MG PO TABS
650.0000 mg | ORAL_TABLET | Freq: Four times a day (QID) | ORAL | Status: DC | PRN
  Administered 2021-04-06 – 2021-04-07 (×3): 650 mg via ORAL
  Filled 2021-04-05 (×3): qty 2

## 2021-04-05 MED ORDER — ACETAMINOPHEN 650 MG RE SUPP: 650.0000 mg | Freq: Four times a day (QID) | RECTAL | Status: DC | PRN

## 2021-04-05 MED ORDER — IMIPRAMINE HCL 25 MG PO TABS
25.0000 mg | ORAL_TABLET | Freq: Every evening | ORAL | Status: DC
  Administered 2021-04-05 – 2021-04-07 (×3): 25 mg via ORAL
  Filled 2021-04-05 (×4): qty 1

## 2021-04-05 MED ORDER — FAMOTIDINE 20 MG PO TABS
20.0000 mg | ORAL_TABLET | Freq: Every day | ORAL | Status: DC
  Administered 2021-04-06 – 2021-04-08 (×3): 20 mg via ORAL
  Filled 2021-04-05 (×3): qty 1

## 2021-04-05 MED ORDER — SODIUM CHLORIDE 0.9 % IV BOLUS
500.0000 mL | Freq: Once | INTRAVENOUS | Status: AC
  Administered 2021-04-05: 500 mL via INTRAVENOUS

## 2021-04-05 NOTE — ED Notes (Signed)
Medications not yet verified by pharmacy. ?

## 2021-04-05 NOTE — Assessment & Plan Note (Addendum)
At baseline patient has stage IIIb chronic kidney disease with a serum creatinine of 1.95 which has progressively worsened over the last couple of months. ?Recent serum creatinine 3.78 >> 3.94 >>3.79>>3.34 ?Renal ultrasound was without any significant abnormality. ?Nephrology is on board ?-Continue with gentle IV fluid ?-Avoid nephrotoxic agents ?-Continue holding benazepril ?

## 2021-04-05 NOTE — Assessment & Plan Note (Signed)
Continue Pepcid  

## 2021-04-05 NOTE — ED Provider Notes (Signed)
? ?[facility] ?Provider Note ? ?Event Date/Time  ?First MD Initiated Contact with Patient 04/05/21 1050   ?(approximate) ? ?History  ? ?Abnormal Lab ? ?HPI ? ?Kristina Mitchell is a 76 y.o. female reports a history of hypertension and chronic urinary incontinence ? ?She has been seeing nephrology for the last approximately week and a half for concerns around her kidney testing.  She sees Dr. Holley Raring.  She has had 2 test 1 a little over a week ago and then 1 that she had done Thursday that show her GFR has been 11-12.  She was instructed to come to the ER by Dr. Holley Raring today, he advised that she needs to be admitted to the hospital for further treatment due to the concern of a acute kidney injury ?  ?Eating normally.  Ate breakfast today cereal water.  Denies any recent illness denies feeling dehydrated.  Denies any trouble urinating other than she has chronic incontinence for over 20 years, but reports that she has not noticed any trouble with urination. ? ?Physical Exam  ? ?Triage Vital Signs: ?ED Triage Vitals  ?Enc Vitals Group  ?   BP 04/05/21 1049 (!) 142/72  ?   Pulse Rate 04/05/21 1049 67  ?   Resp 04/05/21 1049 18  ?   Temp 04/05/21 1049 97.9 ?F (36.6 ?C)  ?   Temp Source 04/05/21 1049 Oral  ?   SpO2 04/05/21 1049 99 %  ?   Weight 04/05/21 1048 164 lb (74.4 kg)  ?   Height 04/05/21 1048 '5\' 3"'$  (1.6 m)  ?   Head Circumference --   ?   Peak Flow --   ?   Pain Score 04/05/21 1047 0  ?   Pain Loc --   ?   Pain Edu? --   ?   Excl. in [place]? --   ? ? ?Most recent vital signs: ?Vitals:  ? 04/05/21 1049 04/05/21 1300  ?BP: (!) 142/72 140/82  ?Pulse: 67 (!) 56  ?Resp: 18 16  ?Temp: 97.9 ?F (36.6 ?C)   ?SpO2: 99% 100%  ? ? ? ?General: Awake, no distress.  ?Mucous membranes are slightly dry.  Patient drinking bottled water without difficulty ?CV:  Good peripheral perfusion.  Normal heart tones subtle systolic murmur ?Resp:  Normal effort.  Clear lung ?Abd:  No distention.  ?Other:  Mild lower extremity  edema bilaterally ? ? ?ED Results / Procedures / Treatments  ? ?Labs ?(all labs ordered are listed, but only abnormal results are displayed) ?Labs Reviewed  ?CBC - Abnormal; Notable for the following components:  ?    Result Value  ? RBC 3.61 (*)   ? Hemoglobin 10.9 (*)   ? HCT 34.5 (*)   ? All other components within normal limits  ?COMPREHENSIVE METABOLIC PANEL - Abnormal; Notable for the following components:  ? CO2 21 (*)   ? Glucose, Bld 118 (*)   ? BUN 51 (*)   ? Creatinine, Ser 3.79 (*)   ? GFR, Estimated 12 (*)   ? All other components within normal limits  ?URINALYSIS, COMPLETE (UACMP) WITH MICROSCOPIC - Abnormal; Notable for the following components:  ? Color, Urine STRAW (*)   ? APPearance HAZY (*)   ? Leukocytes,Ua SMALL (*)   ? All other components within normal limits  ?CK  ? ? ? ?EKG ? ? ? ? ?RADIOLOGY ? ?I personally reviewed and interpreted the patient's ultrasound images of the kidneys, I do  not grossly see any acute hydronephrosis.  Defer to radiologist for more detailed read ? ?Ultrasound results reviewed by me, no evidence of acute hydronephrosis.  Noted right renal cyst ? ? ? ? ? ?PROCEDURES: ? ?Critical Care performed: No ? ?Procedures ? ? ?MEDICATIONS ORDERED IN ED: ?Medications  ?sodium chloride 0.9 % bolus 500 mL (500 mLs Intravenous New Bag/Given 04/05/21 1223)  ? ? ? ?IMPRESSION / MDM / ASSESSMENT AND PLAN / ED COURSE  ?I reviewed the triage vital signs and the nursing notes. ?             ?               ? ?Differential diagnosis includes, but is not limited to, acute on chronic renal disease.  Reviewed labs, patient with creatinine approaching 4 on her last 2 checks for the last roughly 10 days.  Referred by nephrology for further work-up.  Based on her clinical exam she appears perhaps euvolemic.  She denies any dehydration, denies difficulty urinating.  Does not appear to have a history that would suggest obvious acute urinary retention.  No abdominal pain ? ?Very reassuring evaluation  and work-up, but her labs are quite concerning with her renal disease approaching end-stage renal disease.  Discussed and consulted with Dr. Holley Raring her nephrologist who recommends renal ultrasound and he advises admission to the hospital for further work-up ? ?The patient is on the cardiac monitor to evaluate for evidence of arrhythmia and/or significant heart rate changes. ? ?Labs notable for ongoing elevation of her creatinine consistent with previous check at the nephrologist office.  Normal CK.  Mild anemia hemoglobin 10.9.  Urinalysis reviewed, no convincing evidence of urinary tract infection.  No bacteria seen.  A few squamous cells are noted.  In the setting of no acute dysuria or obvious urinary symptoms I suspect no UTI is present ? ?Clinical Course as of 04/05/21 1340  ?Sat Apr 05, 2021  ?1108 Discussed patient's case with Dr. Holley Raring, he is very familiar with her and referred her here today.  He recommends we start her on normal saline 75 mL/h, obtain a urinalysis metabolic panel CBC renal ultrasound and that she be admitted for expedited work-up of acute worsening of her renal disease. [MQ]  ?[id number] Patient as well as the patient's daughter both understanding very agreeable with the plan for admission [MQ]  ?  ?Clinical Course User Index ?[MQ] Delman Kitten, MD  ? ?Discussed case and care and consulted with hospitalist Dr. Francine Graven.  Patient will be admitted for further care and treatment with anticipation of nephrology consult as well as continued ongoing work-up as to etiology and treatment for acute on chronic renal disease presentation.  Due to increased risk of morbidity and mortality, nephrologist Dr. Holley Raring requested patient be admitted as well, will comply. ? ?FINAL CLINICAL IMPRESSION(S) / ED DIAGNOSES  ? ?Final diagnoses:  ?Acute on chronic renal insufficiency  ? ? ? ?Rx / DC Orders  ? ?ED Discharge Orders   ? ? None  ? ?  ? ? ? ?Note:  This document was prepared using Dragon voice recognition  software and may include unintentional dictation errors. ?  Delman Kitten, MD ?04/05/21 1340 ? ?

## 2021-04-05 NOTE — ED Notes (Signed)
Secure msg sent to Kristina Jewels, LPN for ED to IP SBAR. ?

## 2021-04-05 NOTE — Assessment & Plan Note (Signed)
Patient has anemia secondary to chronic kidney disease ?Monitor H&H closely ?

## 2021-04-05 NOTE — ED Notes (Signed)
Pt unhooked from monitors & IV to ambulate to the toilet. Pt is attempting to give urine sample. ?

## 2021-04-05 NOTE — ED Notes (Addendum)
RN to bedside. Re-initiated IV fluids as they were stopped. Placed pt back on BP and pulse ox for updated vitals. Urine specimen collected and sent.  ?

## 2021-04-05 NOTE — ED Notes (Signed)
This RN called & spoke with lab re: labs not received on blood sent at 1106. They will receive the blood work now & run the labs. ?

## 2021-04-05 NOTE — ED Notes (Signed)
Request made for transport to the floor ?
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
## 2021-04-05 NOTE — ED Triage Notes (Signed)
Pt via POV from home. Pt was seen at Dr. Elwyn Lade office the day before yesterday. States that her kidney function down to 12. Denies any flank pain. Denies any NVD. Pt is A&OX4 and NAD.  ?

## 2021-04-05 NOTE — Assessment & Plan Note (Signed)
Patient with a known history of chronic kidney disease, stage IIIb with acute worsening. ?Treatment as outlined in 1 ?

## 2021-04-05 NOTE — Assessment & Plan Note (Addendum)
Blood pressure is stable ?Continue amlodipine and nadolol ?Keep holding benazepril ?

## 2021-04-05 NOTE — ED Notes (Signed)
Pt states she is a patient of Dr Holley Raring who told her to come to ER for further evaluation of her kidneys. Pt and family member state that blood work showed her kidney function as "1 in March and 12 this past week." Pt denies pain. Denies urinary symptoms. Pt is NOT on dialysis. Pt states she drinks 4 12oz cups of water per day and urinates frequently. Pt states that she does take a medicine to help with her urination.  ?

## 2021-04-06 DIAGNOSIS — N189 Chronic kidney disease, unspecified: Secondary | ICD-10-CM | POA: Diagnosis not present

## 2021-04-06 DIAGNOSIS — K219 Gastro-esophageal reflux disease without esophagitis: Secondary | ICD-10-CM

## 2021-04-06 DIAGNOSIS — I159 Secondary hypertension, unspecified: Secondary | ICD-10-CM

## 2021-04-06 DIAGNOSIS — N179 Acute kidney failure, unspecified: Secondary | ICD-10-CM | POA: Diagnosis not present

## 2021-04-06 DIAGNOSIS — D631 Anemia in chronic kidney disease: Secondary | ICD-10-CM

## 2021-04-06 DIAGNOSIS — N183 Chronic kidney disease, stage 3 unspecified: Secondary | ICD-10-CM | POA: Diagnosis not present

## 2021-04-06 LAB — BASIC METABOLIC PANEL
Anion gap: 7 (ref 5–15)
BUN: 44 mg/dL — ABNORMAL HIGH (ref 8–23)
CO2: 20 mmol/L — ABNORMAL LOW (ref 22–32)
Calcium: 9.5 mg/dL (ref 8.9–10.3)
Chloride: 113 mmol/L — ABNORMAL HIGH (ref 98–111)
Creatinine, Ser: 3.41 mg/dL — ABNORMAL HIGH (ref 0.44–1.00)
GFR, Estimated: 13 mL/min — ABNORMAL LOW (ref >60)
Glucose, Bld: 86 mg/dL (ref 70–99)
Potassium: 4.5 mmol/L (ref 3.5–5.1)
Sodium: 140 mmol/L (ref 135–145)

## 2021-04-06 LAB — CBC
HCT: 29.6 % — ABNORMAL LOW (ref 36.0–46.0)
Hemoglobin: 9.6 g/dL — ABNORMAL LOW (ref 12.0–15.0)
MCH: 30.6 pg (ref 26.0–34.0)
MCHC: 32.4 g/dL (ref 30.0–36.0)
MCV: 94.3 fL (ref 80.0–100.0)
Platelets: 200 10*3/uL (ref 150–400)
RBC: 3.14 MIL/uL — ABNORMAL LOW (ref 3.87–5.11)
RDW: 12.7 % (ref 11.5–15.5)
WBC: 8.2 10*3/uL (ref 4.0–10.5)
nRBC: 0 % (ref 0.0–0.2)

## 2021-04-06 NOTE — Progress Notes (Signed)
?[facility]  ?ROUNDING NOTE  ? ?Subjective:  ? ?Kristina Mitchell is a 76 year old female with past medical conditions including GERD, irritable bowel syndrome, dyslipidemia, hypertension, and chronic kidney disease stage IV.  Patient presents to the emergency department at the request of nephrology due to worsening renal function.  Patient has been admitted for Acute on chronic renal insufficiency [N28.9, N18.9] ?AKI (acute kidney injury) ([place]) [N17.9] ? ?Patient is known to our practice and currently follows Dr. Holley Raring.  Patient was last seen in office on 3/321/23.  The concern at that appointment was also her worsening renal function.  Creatinine at that time was 3.94 with GFR 11.  Baseline appears to be 1.95 with GFR 26 on 11/19/2020.  Patient currently denies nausea, vomiting, and diarrhea out of the usual with her irritable bowel syndrome.  Patient states eating habits have not changed.  She states she has increased her amount of fluid intake.  Denies new medications.  Denies NSAID use only Tylenol.  Lower extremity edema unchanged.  Patient states she still feels great and does not feel sick. ? ?Objective:  ?Vital signs in last 24 hours:  ?Temp:  [97.6 ?F (36.4 ?C)-98.1 ?F (36.7 ?C)] 98.1 ?F (36.7 ?C) (04/02 0865) ?Pulse Rate:  [55-61] 55 (04/02 0807) ?Resp:  [16-17] 17 (04/02 7846) ?BP: (117-176)/(37-91) 125/67 (04/02 9629) ?SpO2:  [98 %-100 %] 100 % (04/02 0807) ? ?Weight change:  ?Filed Weights  ? 04/05/21 1048  ?Weight: 74.4 kg  ? ? ?Intake/Output: ?No intake/output data recorded. ?  ?Intake/Output this shift: ? No intake/output data recorded. ? ?Physical Exam: ?General: NAD, resting in bed  ?Head: Normocephalic, atraumatic. Moist oral mucosal membranes  ?Eyes: Anicteric  ?Lungs:  Clear to auscultation, normal effort, room air  ?Heart: Regular rate and rhythm  ?Abdomen:  Soft, nontender, obese  ?Extremities: Nonpitting peripheral edema.  ?Neurologic: Nonfocal, moving all four extremities   ?Skin: No lesions  ?Access: None  ? ? ?Basic Metabolic Panel: ?Recent Labs  ?Lab 04/05/21 ?1056 04/06/21 ?0336  ?NA 141 140  ?K 4.8 4.5  ?CL 111 113*  ?CO2 21* 20*  ?GLUCOSE 118* 86  ?BUN 51* 44*  ?CREATININE 3.79* 3.41*  ?CALCIUM 9.8 9.5  ? ? ?Liver Function Tests: ?Recent Labs  ?Lab 04/05/21 ?1056  ?AST 22  ?ALT 15  ?ALKPHOS 68  ?BILITOT 0.5  ?PROT 6.9  ?ALBUMIN 3.7  ? ?No results for input(s): LIPASE, AMYLASE in the last 168 hours. ?No results for input(s): AMMONIA in the last 168 hours. ? ?CBC: ?Recent Labs  ?Lab 04/05/21 ?1056 04/06/21 ?0336  ?WBC 7.9 8.2  ?HGB 10.9* 9.6*  ?HCT 34.5* 29.6*  ?MCV 95.6 94.3  ?PLT 251 200  ? ? ?Cardiac Enzymes: ?Recent Labs  ?Lab 04/05/21 ?1056  ?CKTOTAL 77  ? ? ?BNP: ?Invalid input(s): POCBNP ? ?CBG: ?No results for input(s): GLUCAP in the last 168 hours. ? ?Microbiology: ?Results for orders placed or performed in visit on 08/26/15  ?Microscopic Examination     Status: Abnormal  ? Collection Time: 08/26/15 10:20 AM  ? URINE  ?Result Value Ref Range Status  ? WBC, UA 0-5 0 - 5 /hpf Final  ? RBC, UA None seen 0 - 2 /hpf Final  ? Epithelial Cells (non renal) 0-10 0 - 10 /hpf Final  ? Renal Epithel, UA 0-10 (A) None seen /hpf Final  ? Bacteria, UA None seen None seen/Few Final  ? ? ?Coagulation Studies: ?No results for input(s): LABPROT, INR in the last 72  hours. ? ?Urinalysis: ?Recent Labs  ?  04/05/21 ?1302  ?COLORURINE STRAW*  ?LABSPEC 1.009  ?PHURINE 5.0  ?GLUCOSEU NEGATIVE  ?HGBUR NEGATIVE  ?BILIRUBINUR NEGATIVE  ?KETONESUR NEGATIVE  ?PROTEINUR NEGATIVE  ?NITRITE NEGATIVE  ?LEUKOCYTESUR SMALL*  ?  ? ? ?Imaging: ?US Renal ? ?Result Date: 04/05/2021 ?CLINICAL DATA:  Acute kidney injury EXAM: RENAL / URINARY TRACT ULTRASOUND COMPLETE COMPARISON:  08/10/2016 FINDINGS: Right Kidney: Renal measurements: 9.8 x 5.8 x 5.5 cm = volume: 163 mL. Echogenicity within normal limits. Lower pole right renal cyst measuring up to 5.0 cm. No shadowing stone or hydronephrosis visualized. Left Kidney:  Renal measurements: 9.2 x 5.1 x 4.4 cm = volume: 107 mL. Echogenicity within normal limits. No mass, shadowing stone, or hydronephrosis visualized. Bladder: Appears normal for degree of bladder distention. Other: None. IMPRESSION: 1. No evidence of obstructive uropathy. 2. Right renal cyst. Electronically Signed   By: Davina Poke D.O.   On: 04/05/2021 13:06   ? ? ?Medications:  ? ? sodium chloride 75 mL/hr at 04/05/21 2035  ? ? amLODipine  10 mg Oral Daily  ? aspirin EC  81 mg Oral Daily  ? famotidine  20 mg Oral Daily  ? heparin  5,000 Units Subcutaneous Q8H  ? imipramine  25 mg Oral QPM  ? nadolol  40 mg Oral BID  ? rosuvastatin  10 mg Oral Daily  ? ?acetaminophen **OR** acetaminophen, ondansetron **OR** ondansetron (ZOFRAN) IV ? ?Assessment/ Plan:  ?Kristina Mitchell is a 76 y.o.  female with past medical conditions including GERD, irritable bowel syndrome, dyslipidemia, hypertension, and chronic kidney disease stage IV.  Patient presents to the emergency department at the request of nephrology due to worsening renal function.  Patient has been admitted for Acute on chronic renal insufficiency [N28.9, N18.9] ?AKI (acute kidney injury) ([place]) [N17.9] ? ? ?Acute Kidney Injury on chronic kidney disease stage IV with baseline creatinine 1.95 and GFR of 26 on 11/19/20.  ?Active acute kidney injury work-up in progress ?Chronic kidney disease is secondary to hypertension ?Losartan held ?Renal ultrasound negative for obstruction, right renal cyst present and monitoring. No IV contrast exposure.  No acute indication for dialysis at this time but monitoring closely. ? Agree with IV fluids and encouraging patient to eat and drink.  No definitive reason for acute kidney injury but work-up in progress.  We will continue to monitor renal function.  Avoid nephrotoxic agents and therapies.  Avoid hypotension.  Discussed with patient and daughter that we will exhaust all other means but dialysis may be required. ? ?Lab  Results  ?Component Value Date  ? CREATININE 3.41 (H) 04/06/2021  ? CREATININE 3.79 (H) 04/05/2021  ? CREATININE 1.24 (H) 10/07/2014  ? ?No intake or output data in the 24 hours ending 04/06/21 1103 ? ?2.  Hypertension with chronic kidney disease.  Patient currently takes losartan and amlodipine outpatient.  Losartan currently held due to kidney injury. ? ?3. Secondary Hyperparathyroidism: with outpatient labs: PTH 114, phosphorus 4.4, calcium 10.3 on 03/25/21.  ? ?Lab Results  ?Component Value Date  ? CALCIUM 9.5 04/06/2021  ?Cholecalciferol received outpatient ?Calcium within acceptable range. ? ?4. Anemia of chronic kidney disease ?Lab Results  ?Component Value Date  ? HGB 9.6 (L) 04/06/2021  ?  ?Hemoglobin within acceptable range.  We will continue to monitor ? ? LOS: 1 ?[place] ?4/2/202311:03 AM ?  ?

## 2021-04-06 NOTE — Progress Notes (Signed)
?PROGRESS NOTE ? ? ? ?Kristina Mitchell  VFI:[id number] DOB: January 07, 1945 DOA: 04/05/2021 ?PCP: [name], MD  ? ?Brief Narrative:  ?Kristina Mitchell is a 76 y.o. female with medical history significant for hypertension, GERD, irritable bowel syndrome, chronic kidney disease, dyslipidemia who presents to the emergency room at the request of her nephrologist for evaluation of worsening renal function. ?Patient had a serum creatinine of 1.95 in November, 2022 and this has progressively worsened over the last couple of months.  She had her renal function checked twice at the nephrologist office and her serum creatinine was 3.78 >> 3.94.  Repeat creatinine here in the ER is 3.79. ?She complains of frequency of urination but admits to drinking a lot of water.  She denies any changes in her urine output.  She denies having any dysuria, no hematuria, no fever or chills. ?At baseline she has shortness of breath which is unchanged.  She denies having any chest pain, no nausea, no vomiting, no diarrhea, no dizziness, no lightheadedness, no headache, no blurred vision or any focal deficit. ? ? ?Assessment & Plan: ?  ?Principal Problem: ?  AKI (acute kidney injury) ([place]) ?Active Problems: ?  CKD (chronic kidney disease) stage 3, GFR 30-59 ml/min (HCC) ?  Hypertension ?  GERD (gastroesophageal reflux disease) ?  Anemia due to chronic kidney disease ? ? ?AKI (acute kidney injury) ([place]) ?At baseline patient has stage IIIb chronic kidney disease with a serum creatinine of 1.95 which has progressively worsened over the last couple of months. ?Recent serum creatinine 3.78 >> 3.94 >>3.79 ?Judicious IV fluid resuscitation and monitor respiratory status ?Nephrology consulted-further recs per them ?Avoid nephrotoxic agents ?Holding benazepril ?Cr 3.41 today ?  ?CKD (chronic kidney disease) stage 3, GFR 30-59 ml/min (HCC) ?Patient with a known history of chronic kidney disease, stage IIIb with acute worsening. ?Treatment as outlined in 1 ?   ?Anemia due to chronic kidney disease ?Patient has anemia secondary to chronic kidney disease ?Monitor H&H closely ?  ?GERD (gastroesophageal reflux disease) ?Continue Pepcid ?  ?Hypertension ?Blood pressure is stable ?Continue amlodipine and nadolol ?  ? ?DVT prophylaxis: Heparin SQ  ?Code Status: full ? ?  ?Code Status Orders  ?(From admission, onward)  ?  ? ? ?  ? ?  Start     Ordered  ? 04/05/21 1401  Full code  Continuous       ? 04/05/21 1401  ? ?  ?  ? ?  ? ?Code Status History   ? ? Date Active Date Inactive Code Status Order ID Comments User Context  ? 10/06/2014 1316 10/07/2014 1353 Full Code [id number]  Kristina Grayer, MD ED  ? ?  ? ?Family Communication: daughter at bedside  ?Disposition Plan:   pending further eval by neph, not ready for d/c requiring iv fluids ?Consults called: None ?Admission status: Inpatient ? ? ?Consultants:  ?nephrology ? ?Procedures:  ?US Renal ? ?Result Date: 04/05/2021 ?CLINICAL DATA:  Acute kidney injury EXAM: RENAL / URINARY TRACT ULTRASOUND COMPLETE COMPARISON:  08/10/2016 FINDINGS: Right Kidney: Renal measurements: 9.8 x 5.8 x 5.5 cm = volume: 163 mL. Echogenicity within normal limits. Lower pole right renal cyst measuring up to 5.0 cm. No shadowing stone or hydronephrosis visualized. Left Kidney: Renal measurements: 9.2 x 5.1 x 4.4 cm = volume: 107 mL. Echogenicity within normal limits. No mass, shadowing stone, or hydronephrosis visualized. Bladder: Appears normal for degree of bladder distention. Other: None. IMPRESSION: 1. No evidence of obstructive uropathy. 2.  Right renal cyst. Electronically Signed   By: Davina Poke D.O.   On: 04/05/2021 13:06   ? ? ? ? ?Subjective: ?No complaints, frustrated by renal problems ? ?Objective: ?Vitals:  ? 04/05/21 2012 04/05/21 2318 04/06/21 0308 04/06/21 1062  ?BP: (!) 176/91 131/60 (!) 117/37 125/67  ?Pulse: (!) 59 61 (!) 57 (!) 55  ?Resp: '17 17 17 17  '$ ?Temp: 97.7 ?F (36.5 ?C) 97.6 ?F (36.4 ?C) 97.9 ?F (36.6 ?C) 98.1 ?F (36.7 ?C)   ?TempSrc:      ?SpO2: 98% 100% 99% 100%  ?Weight:      ?Height:      ? ?No intake or output data in the 24 hours ending 04/06/21 1456 ?Filed Weights  ? 04/05/21 1048  ?Weight: 74.4 kg  ? ? ?Examination: ? ?General exam: Appears calm and comfortable  ?Respiratory system: Clear to auscultation. Respiratory effort normal. ?Cardiovascular system: S1 & S2 heard, RRR. No JVD, murmurs, rubs, gallops or clicks. No pedal edema. ?Gastrointestinal system: Abdomen is nondistended, soft and nontender. No organomegaly or masses felt. Normal bowel sounds heard. ?Central nervous system: Alert and oriented. No focal neurological deficits. ?Extremities: Symmetric 5 x 5 power. ?Skin: No rashes, lesions or ulcers ?Psychiatry: Judgement and insight appear normal. Mood & affect appropriate.  ? ? ? ?Data Reviewed: I have personally reviewed following labs and imaging studies ? ?CBC: ?Recent Labs  ?Lab 04/05/21 ?1056 04/06/21 ?0336  ?WBC 7.9 8.2  ?HGB 10.9* 9.6*  ?HCT 34.5* 29.6*  ?MCV 95.6 94.3  ?PLT 251 200  ? ?Basic Metabolic Panel: ?Recent Labs  ?Lab 04/05/21 ?1056 04/06/21 ?0336  ?NA 141 140  ?K 4.8 4.5  ?CL 111 113*  ?CO2 21* 20*  ?GLUCOSE 118* 86  ?BUN 51* 44*  ?CREATININE 3.79* 3.41*  ?CALCIUM 9.8 9.5  ? ?GFR: ?Estimated Creatinine Clearance: 13.3 mL/min (A) (by C-G formula based on SCr of 3.41 mg/dL (H)). ?Liver Function Tests: ?Recent Labs  ?Lab 04/05/21 ?1056  ?AST 22  ?ALT 15  ?ALKPHOS 68  ?BILITOT 0.5  ?PROT 6.9  ?ALBUMIN 3.7  ? ?No results for input(s): LIPASE, AMYLASE in the last 168 hours. ?No results for input(s): AMMONIA in the last 168 hours. ?Coagulation Profile: ?No results for input(s): INR, PROTIME in the last 168 hours. ?Cardiac Enzymes: ?Recent Labs  ?Lab 04/05/21 ?1056  ?CKTOTAL 77  ? ?BNP (last 3 results) ?No results for input(s): PROBNP in the last 8760 hours. ?HbA1C: ?No results for input(s): HGBA1C in the last 72 hours. ?CBG: ?No results for input(s): GLUCAP in the last 168 hours. ?Lipid Profile: ?No results  for input(s): CHOL, HDL, LDLCALC, TRIG, CHOLHDL, LDLDIRECT in the last 72 hours. ?Thyroid Function Tests: ?No results for input(s): TSH, T4TOTAL, FREET4, T3FREE, THYROIDAB in the last 72 hours. ?Anemia Panel: ?No results for input(s): VITAMINB12, FOLATE, FERRITIN, TIBC, IRON, RETICCTPCT in the last 72 hours. ?Sepsis Labs: ?No results for input(s): PROCALCITON, LATICACIDVEN in the last 168 hours. ? ?No results found for this or any previous visit (from the past 240 hour(s)).  ? ? ? ? ? ?Radiology Studies: ?US Renal ? ?Result Date: 04/05/2021 ?CLINICAL DATA:  Acute kidney injury EXAM: RENAL / URINARY TRACT ULTRASOUND COMPLETE COMPARISON:  08/10/2016 FINDINGS: Right Kidney: Renal measurements: 9.8 x 5.8 x 5.5 cm = volume: 163 mL. Echogenicity within normal limits. Lower pole right renal cyst measuring up to 5.0 cm. No shadowing stone or hydronephrosis visualized. Left Kidney: Renal measurements: 9.2 x 5.1 x 4.4 cm = volume: 107 mL. Echogenicity  within normal limits. No mass, shadowing stone, or hydronephrosis visualized. Bladder: Appears normal for degree of bladder distention. Other: None. IMPRESSION: 1. No evidence of obstructive uropathy. 2. Right renal cyst. Electronically Signed   By: Davina Poke D.O.   On: 04/05/2021 13:06   ? ? ? ? ? ?Scheduled Meds: ? amLODipine  10 mg Oral Daily  ? aspirin EC  81 mg Oral Daily  ? famotidine  20 mg Oral Daily  ? heparin  5,000 Units Subcutaneous Q8H  ? imipramine  25 mg Oral QPM  ? nadolol  40 mg Oral BID  ? rosuvastatin  10 mg Oral Daily  ? ?Continuous Infusions: ? sodium chloride 75 mL/hr at 04/05/21 2035  ? ? ? LOS: 1 day  ? ? ?Time spent: 35 min ? ? ? ?Nicolette Bang, MD ?[facility] ? ?If 7PM-7AM, please contact night-coverage ? ?04/06/2021, 2:56 PM   ?

## 2021-04-07 DIAGNOSIS — N179 Acute kidney failure, unspecified: Secondary | ICD-10-CM | POA: Diagnosis not present

## 2021-04-07 LAB — BASIC METABOLIC PANEL
Anion gap: 9 (ref 5–15)
BUN: 48 mg/dL — ABNORMAL HIGH (ref 8–23)
CO2: 18 mmol/L — ABNORMAL LOW (ref 22–32)
Calcium: 9.2 mg/dL (ref 8.9–10.3)
Chloride: 112 mmol/L — ABNORMAL HIGH (ref 98–111)
Creatinine, Ser: 3.34 mg/dL — ABNORMAL HIGH (ref 0.44–1.00)
GFR, Estimated: 14 mL/min — ABNORMAL LOW (ref >60)
Glucose, Bld: 87 mg/dL (ref 70–99)
Potassium: 4.4 mmol/L (ref 3.5–5.1)
Sodium: 139 mmol/L (ref 135–145)

## 2021-04-07 NOTE — Progress Notes (Signed)
?Progress Note ? ? ?Patient: Kristina Mitchell FXT:[id number] DOB: June 17, 1945 DOA: 04/05/2021     2 ?DOS: the patient was seen and examined on 04/07/2021 ?  ?Brief hospital course: ?Taken from prior notes. ? ?Kristina Mitchell is a 76 y.o. female with medical history significant for hypertension, GERD, irritable bowel syndrome, chronic kidney disease, dyslipidemia who presents to the emergency room at the request of her nephrologist for evaluation of worsening renal function. ?Patient had a serum creatinine of 1.95 in November, 2022 and this has progressively worsened over the last couple of months.  She had her renal function checked twice at the nephrologist office and her serum creatinine was 3.78 >> 3.94.  Repeat creatinine here in the ER is 3.79. ?She complains of frequency of urination but admits to drinking a lot of water.  She denies any changes in her urine output.  She denies having any dysuria, no hematuria, no fever or chills. ?At baseline she has shortness of breath which is unchanged.  She denies having any chest pain, no nausea, no vomiting, no diarrhea, no dizziness, no lightheadedness, no headache, no blurred vision or any focal deficit. ? ?Nephrology was consulted and she was started on gentle IV fluid. ?Renal ultrasound was without any significant abnormality. ?Nephrology decided to proceed with conservative management, no certain etiology found. ? ?Renal functions started improving.  No urgent need for dialysis. ? ? ?Assessment and Plan: ?* AKI (acute kidney injury) ([place]) ?At baseline patient has stage IIIb chronic kidney disease with a serum creatinine of 1.95 which has progressively worsened over the last couple of months. ?Recent serum creatinine 3.78 >> 3.94 >>3.79>>3.34 ?Renal ultrasound was without any significant abnormality. ?Nephrology is on board ?-Continue with gentle IV fluid ?-Avoid nephrotoxic agents ?-Continue holding benazepril ? ?CKD (chronic kidney disease) stage 3, GFR 30-59 ml/min  (HCC) ?Patient with a known history of chronic kidney disease, stage IIIb with acute worsening. ?Treatment as outlined in 1 ? ?Anemia due to chronic kidney disease ?Patient has anemia secondary to chronic kidney disease ?Monitor H&H closely ? ?GERD (gastroesophageal reflux disease) ?Continue Pepcid ? ?Hypertension ?Blood pressure is stable ?Continue amlodipine and nadolol ?Keep holding benazepril ? ? ?  ? ?Subjective: Patient was seen and examined today.  No complaints.  She wants to go home.  Daughter at bedside. ? ?Physical Exam: ?Vitals:  ? 04/06/21 1948 04/07/21 0423 04/07/21 0744 04/07/21 1654  ?BP: (!) 161/72 (!) 132/59 (!) 123/54 (!) 131/56  ?Pulse: 65 (!) 59 60 (!) 59  ?Resp: '17 16  18  '$ ?Temp: 97.8 ?F (36.6 ?C) 97.8 ?F (36.6 ?C) 97.9 ?F (36.6 ?C) 97.9 ?F (36.6 ?C)  ?TempSrc:      ?SpO2: 100% 97% 100% 98%  ?Weight:      ?Height:      ? ?General.     In no acute distress. ?Pulmonary.  Lungs clear bilaterally, normal respiratory effort. ?CV.  Regular rate and rhythm, no JVD, rub or murmur. ?Abdomen.  Soft, nontender, nondistended, BS positive. ?CNS.  Alert and oriented x3.  No focal neurologic deficit. ?Extremities.  No edema, no cyanosis, pulses intact and symmetrical. ?Psychiatry.  Judgment and insight appears normal. ? ?Data Reviewed: ?Prior notes, labs and images reviewed ? ?Family Communication: Daughter at bedside ? ?Disposition: ?Status is: Inpatient ?Remains inpatient appropriate because: Severity of illness ? ? Planned Discharge Destination: Home ? ?DVT prophylaxis.  Subcu heparin ?Time spent: 45 minutes ? ?This record has been created using Systems analyst. Errors have been  sought and corrected,but may not always be located. Such creation errors do not reflect on the standard of care. ? ?Author: ?Lorella Nimrod, MD ?04/07/2021 5:09 PM ? ?For on call review [email or web address].  ?

## 2021-04-07 NOTE — Plan of Care (Signed)

## 2021-04-07 NOTE — Progress Notes (Signed)
?[facility]  ?ROUNDING NOTE  ? ?Subjective:  ? ?Kristina Mitchell is a 76 year old female with past medical conditions including GERD, irritable bowel syndrome, dyslipidemia, hypertension, and chronic kidney disease stage IV.  Patient presents to the emergency department at the request of nephrology due to worsening renal function.  Patient has been admitted for Acute on chronic renal insufficiency [N28.9, N18.9] ?AKI (acute kidney injury) ([place]) [N17.9] ? ?Patient is known to our practice and currently follows Dr. Holley Raring.  Patient was last seen in office on 3/321/23.   ? ?Patient seen sitting up in chair ?Completed breakfast tray at bedside ?Daughter at bedside ?Denies nausea and vomiting ?Denies shortness of breath ?States she continues to urinate frequently ? ?Creatinine 3.34 ? ? ?Objective:  ?Vital signs in last 24 hours:  ?Temp:  [97.8 ?F (36.6 ?C)-98.5 ?F (36.9 ?C)] 97.9 ?F (36.6 ?C) (04/03 0744) ?Pulse Rate:  [59-65] 60 (04/03 0744) ?Resp:  [16-17] 16 (04/03 0423) ?BP: (123-161)/(54-72) 123/54 (04/03 0744) ?SpO2:  [97 %-100 %] 100 % (04/03 0744) ? ?Weight change:  ?Filed Weights  ? 04/05/21 1048  ?Weight: 74.4 kg  ? ? ?Intake/Output: ?I/O last 3 completed shifts: ?In: 1655.7 [I.V.:1655.7] ?Out: -  ?  ?Intake/Output this shift: ? Total I/O ?In: 480 [P.O.:480] ?Out: -  ? ?Physical Exam: ?General: NAD, sitting in chair  ?Head: Normocephalic, atraumatic. Moist oral mucosal membranes  ?Eyes: Anicteric  ?Lungs:  Clear to auscultation, normal effort, room air  ?Heart: Regular rate and rhythm  ?Abdomen:  Soft, nontender, obese  ?Extremities: Nonpitting peripheral edema.  ?Neurologic: Nonfocal, moving all four extremities  ?Skin: No lesions  ?Access: None  ? ? ?Basic Metabolic Panel: ?Recent Labs  ?Lab 04/05/21 ?1056 04/06/21 ?0336 04/07/21 ?0350  ?NA 141 140 139  ?K 4.8 4.5 4.4  ?CL 111 113* 112*  ?CO2 21* 20* 18*  ?GLUCOSE 118* 86 87  ?BUN 51* 44* 48*  ?CREATININE 3.79* 3.41* 3.34*  ?CALCIUM 9.8 9.5 9.2   ? ? ? ?Liver Function Tests: ?Recent Labs  ?Lab 04/05/21 ?1056  ?AST 22  ?ALT 15  ?ALKPHOS 68  ?BILITOT 0.5  ?PROT 6.9  ?ALBUMIN 3.7  ? ? ?No results for input(s): LIPASE, AMYLASE in the last 168 hours. ?No results for input(s): AMMONIA in the last 168 hours. ? ?CBC: ?Recent Labs  ?Lab 04/05/21 ?1056 04/06/21 ?0336  ?WBC 7.9 8.2  ?HGB 10.9* 9.6*  ?HCT 34.5* 29.6*  ?MCV 95.6 94.3  ?PLT 251 200  ? ? ? ?Cardiac Enzymes: ?Recent Labs  ?Lab 04/05/21 ?1056  ?CKTOTAL 77  ? ? ? ?BNP: ?Invalid input(s): POCBNP ? ?CBG: ?No results for input(s): GLUCAP in the last 168 hours. ? ?Microbiology: ?Results for orders placed or performed in visit on 08/26/15  ?Microscopic Examination     Status: Abnormal  ? Collection Time: 08/26/15 10:20 AM  ? URINE  ?Result Value Ref Range Status  ? WBC, UA 0-5 0 - 5 /hpf Final  ? RBC, UA None seen 0 - 2 /hpf Final  ? Epithelial Cells (non renal) 0-10 0 - 10 /hpf Final  ? Renal Epithel, UA 0-10 (A) None seen /hpf Final  ? Bacteria, UA None seen None seen/Few Final  ? ? ?Coagulation Studies: ?No results for input(s): LABPROT, INR in the last 72 hours. ? ?Urinalysis: ?Recent Labs  ?  04/05/21 ?1302  ?COLORURINE STRAW*  ?LABSPEC 1.009  ?PHURINE 5.0  ?GLUCOSEU NEGATIVE  ?HGBUR NEGATIVE  ?BILIRUBINUR NEGATIVE  ?KETONESUR NEGATIVE  ?PROTEINUR NEGATIVE  ?NITRITE  NEGATIVE  ?LEUKOCYTESUR SMALL*  ? ?  ? ? ?Imaging: ?No results found. ? ? ?Medications:  ? ? sodium chloride 75 mL/hr at 04/07/21 0409  ? ? amLODipine  10 mg Oral Daily  ? aspirin EC  81 mg Oral Daily  ? famotidine  20 mg Oral Daily  ? heparin  5,000 Units Subcutaneous Q8H  ? imipramine  25 mg Oral QPM  ? nadolol  40 mg Oral BID  ? rosuvastatin  10 mg Oral Daily  ? ?acetaminophen **OR** acetaminophen, ondansetron **OR** ondansetron (ZOFRAN) IV ? ?Assessment/ Plan:  ?Ms. Kristina Mitchell is a 76 y.o.  female with past medical conditions including GERD, irritable bowel syndrome, dyslipidemia, hypertension, and chronic kidney disease stage IV.   Patient presents to the emergency department at the request of nephrology due to worsening renal function.  Patient has been admitted for Acute on chronic renal insufficiency [N28.9, N18.9] ?AKI (acute kidney injury) ([place]) [N17.9] ? ? ?Acute Kidney Injury on chronic kidney disease stage IV with baseline creatinine 1.95 and GFR of 26 on 11/19/20.  ?Active acute kidney injury work-up in progress ?Chronic kidney disease is secondary to hypertension ?Losartan held ?Renal ultrasound negative for obstruction, right renal cyst present and monitoring. No IV contrast exposure.  No acute indication for dialysis at this time but monitoring closely. ? Renal function slightly improved. Appetite remains appropriate. Continue IVF. AKI remains unexplained, renal biopsy could provide cause, but we feel it's to invasive at this time. Will continue to monitor and treat conservatively. ? ?Lab Results  ?Component Value Date  ? CREATININE 3.34 (H) 04/07/2021  ? CREATININE 3.41 (H) 04/06/2021  ? CREATININE 3.79 (H) 04/05/2021  ? ? ?Intake/Output Summary (Last 24 hours) at 04/07/2021 1524 ?Last data filed at 04/07/2021 1300 ?Gross per 24 hour  ?Intake 1127.91 ml  ?Output --  ?Net 1127.91 ml  ? ? ?2.  Hypertension with chronic kidney disease.  Patient currently takes losartan and amlodipine outpatient.  Losartan currently held due to kidney injury. ? BP 123/54 ? ?3. Secondary Hyperparathyroidism: with outpatient labs: PTH 114, phosphorus 4.4, calcium 10.3 on 03/25/21.  ? ?Lab Results  ?Component Value Date  ? CALCIUM 9.2 04/07/2021  ?Cholecalciferol received outpatient ?Will continue to monitor ? ?4. Anemia of chronic kidney disease ?Lab Results  ?Component Value Date  ? HGB 9.6 (L) 04/06/2021  ?  ?Hemoglobin at goal ? ? LOS: 2 ?[place] ?4/3/20233:24 PM ?  ?

## 2021-04-07 NOTE — TOC Initial Note (Signed)
Transition of Care (TOC) - Initial/Assessment Note  ? ? ?Patient Details  ?Name: Kristina Mitchell ?MRN: [id number] ?Date of Birth: September 28, 1945 ? ?Transition of Care (TOC) CM/SW Contact:    ?Conception Oms, RN ?Phone Number: ?04/07/2021, 8:57 AM ? ?Clinical Narrative:                 ? ? ?Transition of Care (TOC) Screening Note ? ? ?Patient Details  ?Name: Kristina Mitchell ?Date of Birth: 05/31/1945 ? ? ?Transition of Care (TOC) CM/SW Contact:    ?Conception Oms, RN ?Phone Number: ?04/07/2021, 8:57 AM ? ? ? ?Transition of Care Department [facility]) has reviewed patient and no TOC needs have been identified at this time. We will continue to monitor patient advancement through interdisciplinary progression rounds. If new patient transition needs arise, please place a TOC consult. ?  ?  ?  ? ? ?Patient Goals and CMS Choice ?  ?  ?  ? ?Expected Discharge Plan and Services ?  ?  ?  ?  ?  ?                ?  ?  ?  ?  ?  ?  ?  ?  ?  ?  ? ?Prior Living Arrangements/Services ?  ?  ?  ?       ?  ?  ?  ?  ? ?Activities of Daily Living ?Home Assistive Devices/Equipment: Gilford Rile (specify type) ?ADL Screening (condition at time of admission) ?Patient's cognitive ability adequate to safely complete daily activities?: No ?Is the patient deaf or have difficulty hearing?: No ?Does the patient have difficulty seeing, even when wearing glasses/contacts?: No ?Does the patient have difficulty concentrating, remembering, or making decisions?: No ?Patient able to express need for assistance with ADLs?: Yes ?Does the patient have difficulty dressing or bathing?: No ?Independently performs ADLs?: Yes (appropriate for developmental age) ?Does the patient have difficulty walking or climbing stairs?: Yes ?Weakness of Arms/Hands: Both (shoulders) ? ?Permission Sought/Granted ?  ?  ?   ?   ?   ?   ? ?Emotional Assessment ?  ?  ?  ?  ?  ?  ? ?Admission diagnosis:  Acute on chronic renal insufficiency [N28.9, N18.9] ?AKI (acute kidney injury) ([place])  [N17.9] ?Patient Active Problem List  ? Diagnosis Date Noted  ? AKI (acute kidney injury) ([place]) 04/05/2021  ? CKD (chronic kidney disease) stage 3, GFR 30-59 ml/min (HCC) 04/05/2021  ? GERD (gastroesophageal reflux disease)   ? Anemia due to chronic kidney disease   ? Left flank pain, chronic 02/05/2015  ? 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 Double vision with both eyes open 10/06/2014  ? Special screening for malignant neoplasms, colon   ? Benign neoplasm of ascending colon   ? Benign neoplasm of descending colon   ? Benign neoplasm of sigmoid colon   ? Hypertension 09/12/2014  ? Hyperlipidemia 09/12/2014  ? IBS (irritable bowel syndrome) 09/12/2014  ? ?PCP:  [name], MD ?Pharmacy:   ?[facility] [place] [facility] [address] [facility] ?[place] [address] ?Phone: [phone] Fax: [phone] ? ? ? ? ?Social Determinants of Health (SDOH) Interventions ?  ? ?Readmission Risk Interventions ?   ? View : No data to display.  ?  ?  ?  ? ? ? ?

## 2021-04-07 NOTE — Hospital Course (Addendum)
Taken from prior notes. ? ?Kristina Mitchell is a 76 y.o. female with medical history significant for hypertension, GERD, irritable bowel syndrome, chronic kidney disease, dyslipidemia who presents to the emergency room at the request of her nephrologist for evaluation of worsening renal function. ?Patient had a serum creatinine of 1.95 in November, 2022 and this has progressively worsened over the last couple of months.  She had her renal function checked twice at the nephrologist office and her serum creatinine was 3.78 >> 3.94.  Repeat creatinine here in the ER is 3.79. ?She complains of frequency of urination but admits to drinking a lot of water.  She denies any changes in her urine output.  She denies having any dysuria, no hematuria, no fever or chills. ?At baseline she has shortness of breath which is unchanged.  She denies having any chest pain, no nausea, no vomiting, no diarrhea, no dizziness, no lightheadedness, no headache, no blurred vision or any focal deficit. ? ?Nephrology was consulted and she was started on gentle IV fluid. ?Renal ultrasound was without any significant abnormality.  No contrast exposure. ?Nephrology decided to proceed with conservative management, no certain etiology found. ? ?Renal functions started improving.  And currently stable. ? No urgent need for dialysis. ?Nephrology is suggesting renal biopsy to rule out allergic interstitial nephritis secondary to Prilosec use.  Her home dose of losartan and Prilosec was discontinued.  Patient is on aspirin. ?After talking with interventional radiologist it was thought to be best done as outpatient earlier next week as patient would like to go home.  Remained asymptomatic. ? ?Patient is being discharged on current medications, discontinuing Prilosec and losartan. ?IR will call her for biopsy appointment and instructions when to hold aspirin. ? ?She will continue with current management and follow-up with her providers. ? ? ?

## 2021-04-08 DIAGNOSIS — N179 Acute kidney failure, unspecified: Secondary | ICD-10-CM | POA: Diagnosis not present

## 2021-04-08 LAB — BASIC METABOLIC PANEL
Anion gap: 6 (ref 5–15)
BUN: 46 mg/dL — ABNORMAL HIGH (ref 8–23)
CO2: 19 mmol/L — ABNORMAL LOW (ref 22–32)
Calcium: 9.3 mg/dL (ref 8.9–10.3)
Chloride: 117 mmol/L — ABNORMAL HIGH (ref 98–111)
Creatinine, Ser: 3.37 mg/dL — ABNORMAL HIGH (ref 0.44–1.00)
GFR, Estimated: 14 mL/min — ABNORMAL LOW (ref >60)
Glucose, Bld: 84 mg/dL (ref 70–99)
Potassium: 4.1 mmol/L (ref 3.5–5.1)
Sodium: 142 mmol/L (ref 135–145)

## 2021-04-08 MED ORDER — SODIUM BICARBONATE 650 MG PO TABS
650.0000 mg | ORAL_TABLET | Freq: Two times a day (BID) | ORAL | Status: DC
  Administered 2021-04-08: 650 mg via ORAL
  Filled 2021-04-08: qty 1

## 2021-04-08 MED ORDER — SODIUM BICARBONATE 650 MG PO TABS
650.0000 mg | ORAL_TABLET | Freq: Two times a day (BID) | ORAL | 0 refills | Status: AC
Start: 2021-04-08 — End: ?

## 2021-04-08 NOTE — Progress Notes (Signed)
Patient received discharge instructions written and verbally, understanding acknowledged. ?

## 2021-04-08 NOTE — Care Management Important Message (Signed)
Important Message ? ?Patient Details  ?Name: Kristina Mitchell ?MRN: [id number] ?Date of Birth: [birth year] ? ? ?Medicare Important Message Given:  Yes ? ? ? ? ?Juliann Pulse A Neftali Abair ?04/08/2021, 1:45 PM ?

## 2021-04-08 NOTE — Discharge Summary (Signed)
?Physician Discharge Summary ?  ?Patient: Kristina Mitchell MRN: [id number] DOB: January 25, 1945  ?Admit date:     04/05/2021  ?Discharge date: 04/08/21  ?Discharge Physician: Lorella Nimrod  ? ?PCP: [name], MD  ? ?Recommendations at discharge:  ?Please obtain BMP in 2 to 3 days. ?Follow-up closely with nephrology. ?Follow-up with primary care provider. ?Outpatient renal biopsy to rule out allergic interstitial nephritis. ? ?Discharge Diagnoses: ?Principal Problem: ?  AKI (acute kidney injury) ([place]) ?Active Problems: ?  CKD (chronic kidney disease) stage 3, GFR 30-59 ml/min (HCC) ?  Hypertension ?  GERD (gastroesophageal reflux disease) ?  Anemia due to chronic kidney disease ? ?Resolved Problems: ?  * No resolved hospital problems. * ? ?Hospital Course: ?Taken from prior notes. ? ?Kristina Mitchell is a 76 y.o. female with medical history significant for hypertension, GERD, irritable bowel syndrome, chronic kidney disease, dyslipidemia who presents to the emergency room at the request of her nephrologist for evaluation of worsening renal function. ?Patient had a serum creatinine of 1.95 in November, 2022 and this has progressively worsened over the last couple of months.  She had her renal function checked twice at the nephrologist office and her serum creatinine was 3.78 >> 3.94.  Repeat creatinine here in the ER is 3.79. ?She complains of frequency of urination but admits to drinking a lot of water.  She denies any changes in her urine output.  She denies having any dysuria, no hematuria, no fever or chills. ?At baseline she has shortness of breath which is unchanged.  She denies having any chest pain, no nausea, no vomiting, no diarrhea, no dizziness, no lightheadedness, no headache, no blurred vision or any focal deficit. ? ?Nephrology was consulted and she was started on gentle IV fluid. ?Renal ultrasound was without any significant abnormality.  No contrast exposure. ?Nephrology decided to proceed with conservative  management, no certain etiology found. ? ?Renal functions started improving.  And currently stable. ? No urgent need for dialysis. ?Nephrology is suggesting renal biopsy to rule out allergic interstitial nephritis secondary to Prilosec use.  Her home dose of losartan and Prilosec was discontinued.  Patient is on aspirin. ?After talking with interventional radiologist it was thought to be best done as outpatient earlier next week as patient would like to go home.  Remained asymptomatic. ? ?Patient is being discharged on current medications, discontinuing Prilosec and losartan. ?IR will call her for biopsy appointment and instructions when to hold aspirin. ? ?She will continue with current management and follow-up with her providers. ? ? ?Assessment and Plan: ?* AKI (acute kidney injury) ([place]) ?At baseline patient has stage IIIb chronic kidney disease with a serum creatinine of 1.95 which has progressively worsened over the last couple of months. ?Recent serum creatinine 3.78 >> 3.94 >>3.79>>3.34 ?Renal ultrasound was without any significant abnormality. ?Nephrology is on board ?-Continue with gentle IV fluid ?-Avoid nephrotoxic agents ?-Continue holding benazepril ? ?CKD (chronic kidney disease) stage 3, GFR 30-59 ml/min (HCC) ?Patient with a known history of chronic kidney disease, stage IIIb with acute worsening. ?Treatment as outlined in 1 ? ?Anemia due to chronic kidney disease ?Patient has anemia secondary to chronic kidney disease ?Monitor H&H closely ? ?GERD (gastroesophageal reflux disease) ?Continue Pepcid ? ?Hypertension ?Blood pressure is stable ?Continue amlodipine and nadolol ?Keep holding benazepril ? ? ?Consultants: Nephrology ?Procedures performed: None ?Disposition: Home ?Diet recommendation:  ?Discharge Diet Orders (From admission, onward)  ? ?  Start     Ordered  ? 04/08/21  0000  Diet - low sodium heart healthy       ? 04/08/21 1321  ? ?  ?  ? ?  ? ?Cardiac diet ?DISCHARGE MEDICATION: ?Allergies as  of 04/08/2021   ? ?   Reactions  ? Sulfa Antibiotics Hives, Swelling  ? Benazepril Rash  ? ?  ? ?  ?Medication List  ?  ? ?STOP taking these medications   ? ?benazepril 20 MG tablet ?Commonly known as: LOTENSIN ?  ?losartan 25 MG tablet ?Commonly known as: COZAAR ?  ?omeprazole 20 MG capsule ?Commonly known as: PRILOSEC ?  ?ranitidine 150 MG tablet ?Commonly known as: ZANTAC ?  ?simvastatin 20 MG tablet ?Commonly known as: ZOCOR ?  ? ?  ? ?TAKE these medications   ? ?acetaminophen 500 MG tablet ?Commonly known as: TYLENOL ?Take 500 mg by mouth every 6 (six) hours as needed for fever, headache, moderate pain or mild pain. ?  ?amLODipine 10 MG tablet ?Commonly known as: NORVASC ?Take 10 mg by mouth daily. ?  ?aspirin 81 MG EC tablet ?Take 1 tablet (81 mg total) by mouth daily. ?  ?Cholecalciferol 50 MCG (2000 UT) Caps ?Take by mouth. ?  ?imipramine 25 MG tablet ?Commonly known as: TOFRANIL ?Take 25 mg by mouth every evening. ?  ?nadolol 40 MG tablet ?Commonly known as: CORGARD ?Take 40 mg by mouth 2 (two) times daily. ?  ?rosuvastatin 10 MG tablet ?Commonly known as: CRESTOR ?Take 10 mg by mouth daily. ?  ?sodium bicarbonate 650 MG tablet ?Take 1 tablet (650 mg total) by mouth 2 (two) times daily. ?  ? ?  ? ? Follow-up Information   ? ? [name], MD. Schedule an appointment as soon as possible for a visit in 1 week(s).   ?Specialty: Family Medicine ?Contact information: ?[address] ?[place] [address] ?[phone] ? ? ?  ?  ? ? Lateef, Munsoor, MD. Schedule an appointment as soon as possible for a visit in 1 week(s).   ?Specialty: Nephrology ?Contact information: ?[address] ?[address] ?[place] [address] ?[phone] ? ? ?  ?  ? ?  ?  ? ?  ? ?Discharge Exam: ?Filed Weights  ? 04/05/21 1048  ?Weight: 74.4 kg  ? ?General.     In no acute distress. ?Pulmonary.  Lungs clear bilaterally, normal respiratory effort. ?CV.  Regular rate and rhythm, no JVD, rub or murmur. ?Abdomen.  Soft, nontender, nondistended,  BS positive. ?CNS.  Alert and oriented x3.  No focal neurologic deficit. ?Extremities.  No edema, no cyanosis, pulses intact and symmetrical. ?Psychiatry.  Judgment and insight appears normal.  ? ?Condition at discharge: stable ? ?The results of significant diagnostics from this hospitalization (including imaging, microbiology, ancillary and laboratory) are listed below for reference.  ? ?Imaging Studies: ?US Renal ? ?Result Date: 04/05/2021 ?CLINICAL DATA:  Acute kidney injury EXAM: RENAL / URINARY TRACT ULTRASOUND COMPLETE COMPARISON:  08/10/2016 FINDINGS: Right Kidney: Renal measurements: 9.8 x 5.8 x 5.5 cm = volume: 163 mL. Echogenicity within normal limits. Lower pole right renal cyst measuring up to 5.0 cm. No shadowing stone or hydronephrosis visualized. Left Kidney: Renal measurements: 9.2 x 5.1 x 4.4 cm = volume: 107 mL. Echogenicity within normal limits. No mass, shadowing stone, or hydronephrosis visualized. Bladder: Appears normal for degree of bladder distention. Other: None. IMPRESSION: 1. No evidence of obstructive uropathy. 2. Right renal cyst. Electronically Signed   By: Davina Poke D.O.   On: 04/05/2021 13:06   ? ?  Microbiology: ?Results for orders placed or performed in visit on 08/26/15  ?Microscopic Examination     Status: Abnormal  ? Collection Time: 08/26/15 10:20 AM  ? URINE  ?Result Value Ref Range Status  ? WBC, UA 0-5 0 - 5 /hpf Final  ? RBC, UA None seen 0 - 2 /hpf Final  ? Epithelial Cells (non renal) 0-10 0 - 10 /hpf Final  ? Renal Epithel, UA 0-10 (A) None seen /hpf Final  ? Bacteria, UA None seen None seen/Few Final  ? ? ?Labs: ?CBC: ?Recent Labs  ?Lab 04/05/21 ?1056 04/06/21 ?0336  ?WBC 7.9 8.2  ?HGB 10.9* 9.6*  ?HCT 34.5* 29.6*  ?MCV 95.6 94.3  ?PLT 251 200  ? ?Basic Metabolic Panel: ?Recent Labs  ?Lab 04/05/21 ?1056 04/06/21 ?0336 04/07/21 ?0350 04/08/21 ?0350  ?NA 141 140 139 142  ?K 4.8 4.5 4.4 4.1  ?CL 111 113* 112* 117*  ?CO2 21* 20* 18* 19*  ?GLUCOSE 118* 86 87 84  ?BUN 51*  44* 48* 46*  ?CREATININE 3.79* 3.41* 3.34* 3.37*  ?CALCIUM 9.8 9.5 9.2 9.3  ? ?Liver Function Tests: ?Recent Labs  ?Lab 04/05/21 ?1056  ?AST 22  ?ALT 15  ?ALKPHOS 68  ?BILITOT 0.5  ?PROT 6.9  ?ALBU

## 2021-04-08 NOTE — Progress Notes (Addendum)
?[facility]  ?ROUNDING NOTE  ? ?Subjective:  ? ?Kristina Mitchell is a 76 year old female with past medical conditions including GERD, irritable bowel syndrome, dyslipidemia, hypertension, and chronic kidney disease stage IV.  Patient presents to the emergency department at the request of nephrology due to worsening renal function.  Patient has been admitted for Acute on chronic renal insufficiency [N28.9, N18.9] ?AKI (acute kidney injury) ([place]) [N17.9] ? ?Patient is known to our practice and currently follows Dr. Holley Raring.  Patient was last seen in office on 3/321/23.   ? ?Patient seen sitting in chair ?Daughter at bedside ?Completed breakfast tray at bedside ?Tolerating meals ? ?Creatinine 3.37 ? ? ?Objective:  ?Vital signs in last 24 hours:  ?Temp:  [97.9 ?F (36.6 ?C)-98.1 ?F (36.7 ?C)] 98.1 ?F (36.7 ?C) (04/04 0740) ?Pulse Rate:  [55-59] 55 (04/04 0740) ?Resp:  [18] 18 (04/04 0740) ?BP: (112-147)/(56-68) 130/68 (04/04 0740) ?SpO2:  [98 %-100 %] 99 % (04/04 0740) ? ?Weight change:  ?Filed Weights  ? 04/05/21 1048  ?Weight: 74.4 kg  ? ? ?Intake/Output: ?I/O last 3 completed shifts: ?In: 2765.9 [P.O.:480; I.V.:2285.9] ?Out: 3 [Urine:3] ?  ?Intake/Output this shift: ? No intake/output data recorded. ? ?Physical Exam: ?General: NAD, sitting in chair  ?Head: Normocephalic, atraumatic. Moist oral mucosal membranes  ?Eyes: Anicteric  ?Lungs:  Clear to auscultation, normal effort, room air  ?Heart: Regular rate and rhythm  ?Abdomen:  Soft, nontender, obese  ?Extremities: Nonpitting peripheral edema.  ?Neurologic: Nonfocal, moving all four extremities  ?Skin: No lesions  ?Access: None  ? ? ?Basic Metabolic Panel: ?Recent Labs  ?Lab 04/05/21 ?1056 04/06/21 ?0336 04/07/21 ?0350 04/08/21 ?0350  ?NA 141 140 139 142  ?K 4.8 4.5 4.4 4.1  ?CL 111 113* 112* 117*  ?CO2 21* 20* 18* 19*  ?GLUCOSE 118* 86 87 84  ?BUN 51* 44* 48* 46*  ?CREATININE 3.79* 3.41* 3.34* 3.37*  ?CALCIUM 9.8 9.5 9.2 9.3  ? ? ? ?Liver Function  Tests: ?Recent Labs  ?Lab 04/05/21 ?1056  ?AST 22  ?ALT 15  ?ALKPHOS 68  ?BILITOT 0.5  ?PROT 6.9  ?ALBUMIN 3.7  ? ? ?No results for input(s): LIPASE, AMYLASE in the last 168 hours. ?No results for input(s): AMMONIA in the last 168 hours. ? ?CBC: ?Recent Labs  ?Lab 04/05/21 ?1056 04/06/21 ?0336  ?WBC 7.9 8.2  ?HGB 10.9* 9.6*  ?HCT 34.5* 29.6*  ?MCV 95.6 94.3  ?PLT 251 200  ? ? ? ?Cardiac Enzymes: ?Recent Labs  ?Lab 04/05/21 ?1056  ?CKTOTAL 77  ? ? ? ?BNP: ?Invalid input(s): POCBNP ? ?CBG: ?No results for input(s): GLUCAP in the last 168 hours. ? ?Microbiology: ?Results for orders placed or performed in visit on 08/26/15  ?Microscopic Examination     Status: Abnormal  ? Collection Time: 08/26/15 10:20 AM  ? URINE  ?Result Value Ref Range Status  ? WBC, UA 0-5 0 - 5 /hpf Final  ? RBC, UA None seen 0 - 2 /hpf Final  ? Epithelial Cells (non renal) 0-10 0 - 10 /hpf Final  ? Renal Epithel, UA 0-10 (A) None seen /hpf Final  ? Bacteria, UA None seen None seen/Few Final  ? ? ?Coagulation Studies: ?No results for input(s): LABPROT, INR in the last 72 hours. ? ?Urinalysis: ?Recent Labs  ?  04/05/21 ?1302  ?COLORURINE STRAW*  ?LABSPEC 1.009  ?PHURINE 5.0  ?GLUCOSEU NEGATIVE  ?HGBUR NEGATIVE  ?BILIRUBINUR NEGATIVE  ?KETONESUR NEGATIVE  ?PROTEINUR NEGATIVE  ?NITRITE NEGATIVE  ?LEUKOCYTESUR SMALL*  ? ?  ? ? ?  Imaging: ?No results found. ? ? ?Medications:  ? ? ? ? amLODipine  10 mg Oral Daily  ? aspirin EC  81 mg Oral Daily  ? famotidine  20 mg Oral Daily  ? heparin  5,000 Units Subcutaneous Q8H  ? imipramine  25 mg Oral QPM  ? nadolol  40 mg Oral BID  ? rosuvastatin  10 mg Oral Daily  ? sodium bicarbonate  650 mg Oral BID  ? ?acetaminophen **OR** acetaminophen, ondansetron **OR** ondansetron (ZOFRAN) IV ? ?Assessment/ Plan:  ?Ms. Kristina Mitchell is a 76 y.o.  female with past medical conditions including GERD, irritable bowel syndrome, dyslipidemia, hypertension, and chronic kidney disease stage IV.  Patient presents to the  emergency department at the request of nephrology due to worsening renal function.  Patient has been admitted for Acute on chronic renal insufficiency [N28.9, N18.9] ?AKI (acute kidney injury) ([place]) [N17.9] ? ? ?Acute Kidney Injury on chronic kidney disease stage IV with baseline creatinine 1.95 and GFR of 26 on 11/19/20.  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  ?Losartan held ?Renal ultrasound negative for obstruction, right renal cyst present and monitoring. No IV contrast exposure.  No acute indication for dialysis at this time but monitoring closely. ? 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 Patient and daughter agreeable to proceed with renal biopsy for diagnosis and treatment options. Patient prescribed Asprin '81mg'$   daily. IR request 3-5 day hold on Aspirin before performing biopsy. Patient is cleared to discharge and return early next week for renal biopsy.  IVF stopped. Will monitor renal function. ?  ? ?Lab Results  ?Component Value Date  ? CREATININE 3.37 (H) 04/08/2021  ? CREATININE 3.34 (H) 04/07/2021  ? CREATININE 3.41 (H) 04/06/2021  ? ? ?Intake/Output Summary (Last 24 hours) at 04/08/2021 1016 ?Last data filed at 04/08/2021 0327 ?Gross per 24 hour  ?Intake 1758 ml  ?Output 3 ml  ?Net 1755 ml  ? ? ? ?2.  Hypertension with chronic kidney disease.  Patient currently takes losartan and amlodipine outpatient.  Losartan currently held due to kidney injury. ? BP stable for this patient ? ?3. Secondary Hyperparathyroidism: with outpatient labs: PTH 114, phosphorus 4.4, calcium 10.3 on 03/25/21.  ? ?Lab Results  ?Component Value Date  ? CALCIUM 9.3 04/08/2021  ?Cholecalciferol received outpatient ?Calcium at goal ? ?4. Anemia of chronic kidney disease ?Lab Results  ?Component Value Date  ? HGB 9.6 (L) 04/06/2021  ?  ?Hemoglobin within desired target ? ? LOS: 3 ?[employer] ?4/4/202310:16 AM ?  ?

## 2021-04-29 ENCOUNTER — Other Ambulatory Visit: Payer: Self-pay

## 2021-04-29 ENCOUNTER — Emergency Department: Payer: Medicare Other

## 2021-04-29 DIAGNOSIS — R739 Hyperglycemia, unspecified: Secondary | ICD-10-CM | POA: Diagnosis present

## 2021-04-29 DIAGNOSIS — I959 Hypotension, unspecified: Secondary | ICD-10-CM | POA: Diagnosis present

## 2021-04-29 DIAGNOSIS — Z7982 Long term (current) use of aspirin: Secondary | ICD-10-CM

## 2021-04-29 DIAGNOSIS — U071 COVID-19: Principal | ICD-10-CM | POA: Diagnosis present

## 2021-04-29 DIAGNOSIS — Z79899 Other long term (current) drug therapy: Secondary | ICD-10-CM

## 2021-04-29 DIAGNOSIS — K219 Gastro-esophageal reflux disease without esophagitis: Secondary | ICD-10-CM | POA: Diagnosis present

## 2021-04-29 DIAGNOSIS — D631 Anemia in chronic kidney disease: Secondary | ICD-10-CM | POA: Diagnosis present

## 2021-04-29 DIAGNOSIS — K589 Irritable bowel syndrome without diarrhea: Secondary | ICD-10-CM | POA: Diagnosis present

## 2021-04-29 DIAGNOSIS — Z87891 Personal history of nicotine dependence: Secondary | ICD-10-CM

## 2021-04-29 DIAGNOSIS — I120 Hypertensive chronic kidney disease with stage 5 chronic kidney disease or end stage renal disease: Secondary | ICD-10-CM | POA: Diagnosis present

## 2021-04-29 DIAGNOSIS — N185 Chronic kidney disease, stage 5: Secondary | ICD-10-CM | POA: Diagnosis present

## 2021-04-29 DIAGNOSIS — J1282 Pneumonia due to coronavirus disease 2019: Secondary | ICD-10-CM | POA: Diagnosis present

## 2021-04-29 DIAGNOSIS — R778 Other specified abnormalities of plasma proteins: Secondary | ICD-10-CM | POA: Diagnosis not present

## 2021-04-29 DIAGNOSIS — E785 Hyperlipidemia, unspecified: Secondary | ICD-10-CM | POA: Diagnosis present

## 2021-04-29 DIAGNOSIS — Z9851 Tubal ligation status: Secondary | ICD-10-CM

## 2021-04-29 DIAGNOSIS — Z888 Allergy status to other drugs, medicaments and biological substances status: Secondary | ICD-10-CM

## 2021-04-29 DIAGNOSIS — Z8249 Family history of ischemic heart disease and other diseases of the circulatory system: Secondary | ICD-10-CM

## 2021-04-29 DIAGNOSIS — Z882 Allergy status to sulfonamides status: Secondary | ICD-10-CM

## 2021-04-29 DIAGNOSIS — Z8719 Personal history of other diseases of the digestive system: Secondary | ICD-10-CM

## 2021-04-29 DIAGNOSIS — J9601 Acute respiratory failure with hypoxia: Secondary | ICD-10-CM | POA: Diagnosis present

## 2021-04-29 DIAGNOSIS — Z96653 Presence of artificial knee joint, bilateral: Secondary | ICD-10-CM | POA: Diagnosis present

## 2021-04-29 DIAGNOSIS — Z9049 Acquired absence of other specified parts of digestive tract: Secondary | ICD-10-CM

## 2021-04-29 DIAGNOSIS — I248 Other forms of acute ischemic heart disease: Secondary | ICD-10-CM | POA: Diagnosis present

## 2021-04-29 LAB — CBC
HCT: 33.6 % — ABNORMAL LOW (ref 36.0–46.0)
Hemoglobin: 10.5 g/dL — ABNORMAL LOW (ref 12.0–15.0)
MCH: 30 pg (ref 26.0–34.0)
MCHC: 31.3 g/dL (ref 30.0–36.0)
MCV: 96 fL (ref 80.0–100.0)
Platelets: 214 10*3/uL (ref 150–400)
RBC: 3.5 MIL/uL — ABNORMAL LOW (ref 3.87–5.11)
RDW: 12.7 % (ref 11.5–15.5)
WBC: 11.2 10*3/uL — ABNORMAL HIGH (ref 4.0–10.5)
nRBC: 0 % (ref 0.0–0.2)

## 2021-04-29 LAB — BASIC METABOLIC PANEL
Anion gap: 10 (ref 5–15)
BUN: 39 mg/dL — ABNORMAL HIGH (ref 8–23)
CO2: 24 mmol/L (ref 22–32)
Calcium: 9.8 mg/dL (ref 8.9–10.3)
Chloride: 103 mmol/L (ref 98–111)
Creatinine, Ser: 3.32 mg/dL — ABNORMAL HIGH (ref 0.44–1.00)
GFR, Estimated: 14 mL/min — ABNORMAL LOW (ref >60)
Glucose, Bld: 165 mg/dL — ABNORMAL HIGH (ref 70–99)
Potassium: 4.2 mmol/L (ref 3.5–5.1)
Sodium: 137 mmol/L (ref 135–145)

## 2021-04-29 NOTE — ED Notes (Signed)
First nurse note- pt brought in via ems from home iwht weakness, sob and a cough.  Sx began yesterday.  Oxygen sats 88% room air on scene.  Pt on 4 liters [place].  Iv in place  '125mg'$  solumedrol given and alb treatment.    Fsbs 189 per ems.  Pt slid down her bed due to weakness but did not fall at home today.   ?

## 2021-04-29 NOTE — ED Triage Notes (Signed)
Pt to ED via [facility] from home. Pt reports she couldn't get out of bed this morning and slid out of bed on the floor and she couldn't get up. Pt reports she normally walks without any assistive device. Pt denies CP or SOB. Pt on 2L [place] placed by EMS - unsure of oxygen sat prior to [place]. Pt also reports a cough that started last night.  ? ?Pt reports taken off ASA due to upcoming kidney biopsy.  ?

## 2021-04-30 ENCOUNTER — Inpatient Hospital Stay (HOSPITAL_COMMUNITY)
Admit: 2021-04-30 | Discharge: 2021-04-30 | Disposition: A | Discharge status: Home / Self Care | Payer: Medicare Other | Attending: Internal Medicine | Admitting: Internal Medicine

## 2021-04-30 ENCOUNTER — Other Ambulatory Visit: Payer: Self-pay

## 2021-04-30 ENCOUNTER — Inpatient Hospital Stay: Payer: Medicare Other

## 2021-04-30 ENCOUNTER — Inpatient Hospital Stay
Admission: EM | Admit: 2021-04-30 | Discharge: 2021-05-01 | DRG: 177 | Disposition: A | Discharge status: Home / Self Care | Payer: Medicare Other | Attending: Internal Medicine | Admitting: Internal Medicine

## 2021-04-30 DIAGNOSIS — E785 Hyperlipidemia, unspecified: Secondary | ICD-10-CM | POA: Diagnosis present

## 2021-04-30 DIAGNOSIS — I5031 Acute diastolic (congestive) heart failure: Secondary | ICD-10-CM

## 2021-04-30 DIAGNOSIS — K219 Gastro-esophageal reflux disease without esophagitis: Secondary | ICD-10-CM | POA: Diagnosis present

## 2021-04-30 DIAGNOSIS — I959 Hypotension, unspecified: Secondary | ICD-10-CM | POA: Diagnosis present

## 2021-04-30 DIAGNOSIS — J9601 Acute respiratory failure with hypoxia: Secondary | ICD-10-CM | POA: Diagnosis present

## 2021-04-30 DIAGNOSIS — Z7982 Long term (current) use of aspirin: Secondary | ICD-10-CM | POA: Diagnosis not present

## 2021-04-30 DIAGNOSIS — K589 Irritable bowel syndrome without diarrhea: Secondary | ICD-10-CM | POA: Diagnosis present

## 2021-04-30 DIAGNOSIS — Z87891 Personal history of nicotine dependence: Secondary | ICD-10-CM | POA: Diagnosis not present

## 2021-04-30 DIAGNOSIS — N185 Chronic kidney disease, stage 5: Secondary | ICD-10-CM | POA: Diagnosis present

## 2021-04-30 DIAGNOSIS — I10 Essential (primary) hypertension: Secondary | ICD-10-CM | POA: Diagnosis not present

## 2021-04-30 DIAGNOSIS — U071 COVID-19: Secondary | ICD-10-CM | POA: Diagnosis present

## 2021-04-30 DIAGNOSIS — R778 Other specified abnormalities of plasma proteins: Secondary | ICD-10-CM | POA: Diagnosis present

## 2021-04-30 DIAGNOSIS — J189 Pneumonia, unspecified organism: Secondary | ICD-10-CM | POA: Diagnosis not present

## 2021-04-30 DIAGNOSIS — J1282 Pneumonia due to coronavirus disease 2019: Secondary | ICD-10-CM | POA: Diagnosis present

## 2021-04-30 DIAGNOSIS — Z96653 Presence of artificial knee joint, bilateral: Secondary | ICD-10-CM | POA: Diagnosis present

## 2021-04-30 DIAGNOSIS — Z882 Allergy status to sulfonamides status: Secondary | ICD-10-CM | POA: Diagnosis not present

## 2021-04-30 DIAGNOSIS — I248 Other forms of acute ischemic heart disease: Secondary | ICD-10-CM | POA: Diagnosis present

## 2021-04-30 DIAGNOSIS — Z8719 Personal history of other diseases of the digestive system: Secondary | ICD-10-CM | POA: Diagnosis not present

## 2021-04-30 DIAGNOSIS — I120 Hypertensive chronic kidney disease with stage 5 chronic kidney disease or end stage renal disease: Secondary | ICD-10-CM | POA: Diagnosis present

## 2021-04-30 DIAGNOSIS — R739 Hyperglycemia, unspecified: Secondary | ICD-10-CM | POA: Diagnosis present

## 2021-04-30 DIAGNOSIS — Z79899 Other long term (current) drug therapy: Secondary | ICD-10-CM | POA: Diagnosis not present

## 2021-04-30 DIAGNOSIS — Z8249 Family history of ischemic heart disease and other diseases of the circulatory system: Secondary | ICD-10-CM | POA: Diagnosis not present

## 2021-04-30 DIAGNOSIS — D631 Anemia in chronic kidney disease: Secondary | ICD-10-CM | POA: Diagnosis present

## 2021-04-30 DIAGNOSIS — Z9049 Acquired absence of other specified parts of digestive tract: Secondary | ICD-10-CM | POA: Diagnosis not present

## 2021-04-30 DIAGNOSIS — Z9851 Tubal ligation status: Secondary | ICD-10-CM | POA: Diagnosis not present

## 2021-04-30 DIAGNOSIS — Z888 Allergy status to other drugs, medicaments and biological substances status: Secondary | ICD-10-CM | POA: Diagnosis not present

## 2021-04-30 DIAGNOSIS — R531 Weakness: Secondary | ICD-10-CM | POA: Diagnosis not present

## 2021-04-30 LAB — CBC
HCT: 27.7 % — ABNORMAL LOW (ref 36.0–46.0)
Hemoglobin: 8.6 g/dL — ABNORMAL LOW (ref 12.0–15.0)
MCH: 30.3 pg (ref 26.0–34.0)
MCHC: 31 g/dL (ref 30.0–36.0)
MCV: 97.5 fL (ref 80.0–100.0)
Platelets: 177 10*3/uL (ref 150–400)
RBC: 2.84 MIL/uL — ABNORMAL LOW (ref 3.87–5.11)
RDW: 12.6 % (ref 11.5–15.5)
WBC: 8.9 10*3/uL (ref 4.0–10.5)
nRBC: 0 % (ref 0.0–0.2)

## 2021-04-30 LAB — TROPONIN I (HIGH SENSITIVITY)
Troponin I (High Sensitivity): 40 ng/L — ABNORMAL HIGH (ref <18)
Troponin I (High Sensitivity): 87 ng/L — ABNORMAL HIGH (ref <18)
Troponin I (High Sensitivity): 76 ng/L — ABNORMAL HIGH (ref <18)
Troponin I (High Sensitivity): 76 ng/L — ABNORMAL HIGH (ref <18)

## 2021-04-30 LAB — ECHOCARDIOGRAM COMPLETE
AR max vel: 2.39 cm2
AV Area VTI: 2.16 cm2
AV Area mean vel: 2.24 cm2
AV Mean grad: 5 mmHg
AV Peak grad: 8 mmHg
Ao pk vel: 1.41 m/s
Area-P 1/2: 5.16 cm2
MV VTI: 2.11 cm2
S' Lateral: 2.48 cm

## 2021-04-30 LAB — URINALYSIS, ROUTINE W REFLEX MICROSCOPIC
Bilirubin Urine: NEGATIVE
Glucose, UA: NEGATIVE mg/dL
Hgb urine dipstick: NEGATIVE
Ketones, ur: NEGATIVE mg/dL
Nitrite: NEGATIVE
Protein, ur: NEGATIVE mg/dL
Specific Gravity, Urine: 1.01 (ref 1.005–1.030)
pH: 6 (ref 5.0–8.0)

## 2021-04-30 LAB — BASIC METABOLIC PANEL
Anion gap: 10 (ref 5–15)
BUN: 39 mg/dL — ABNORMAL HIGH (ref 8–23)
CO2: 22 mmol/L (ref 22–32)
Calcium: 8.9 mg/dL (ref 8.9–10.3)
Chloride: 109 mmol/L (ref 98–111)
Creatinine, Ser: 3.2 mg/dL — ABNORMAL HIGH (ref 0.44–1.00)
GFR, Estimated: 14 mL/min — ABNORMAL LOW (ref >60)
Glucose, Bld: 176 mg/dL — ABNORMAL HIGH (ref 70–99)
Potassium: 3.9 mmol/L (ref 3.5–5.1)
Sodium: 141 mmol/L (ref 135–145)

## 2021-04-30 LAB — TSH: TSH: 0.753 u[IU]/mL (ref 0.350–4.500)

## 2021-04-30 LAB — RESP PANEL BY RT-PCR (FLU A&B, COVID) ARPGX2
Influenza A by PCR: NEGATIVE
Influenza B by PCR: NEGATIVE
SARS Coronavirus 2 by RT PCR: POSITIVE — AB

## 2021-04-30 LAB — PROCALCITONIN: Procalcitonin: 1.1 ng/mL

## 2021-04-30 MED ORDER — TRAZODONE HCL 50 MG PO TABS: 25.0000 mg | ORAL_TABLET | Freq: Every evening | ORAL | Status: DC | PRN

## 2021-04-30 MED ORDER — SODIUM CHLORIDE 0.9 % IV SOLN: INTRAVENOUS | Status: DC

## 2021-04-30 MED ORDER — SODIUM CHLORIDE 0.9% FLUSH: 3.0000 mL | INTRAVENOUS | Status: DC | PRN

## 2021-04-30 MED ORDER — SODIUM CHLORIDE 0.9 % IV BOLUS (SEPSIS)
1000.0000 mL | Freq: Once | INTRAVENOUS | Status: AC
Start: 2021-04-30 — End: 2021-04-30
  Administered 2021-04-30: 1000 mL via INTRAVENOUS

## 2021-04-30 MED ORDER — ATORVASTATIN CALCIUM 20 MG PO TABS: 80.0000 mg | ORAL_TABLET | Freq: Every day | ORAL | Status: DC

## 2021-04-30 MED ORDER — SODIUM CHLORIDE 0.9 % IV SOLN: 250.0000 mL | INTRAVENOUS | Status: DC | PRN

## 2021-04-30 MED ORDER — AMLODIPINE BESYLATE 5 MG PO TABS: 10.0000 mg | ORAL_TABLET | Freq: Every day | ORAL | Status: DC

## 2021-04-30 MED ORDER — HEPARIN SODIUM (PORCINE) 5000 UNIT/ML IJ SOLN
5000.0000 [IU] | Freq: Three times a day (TID) | INTRAMUSCULAR | Status: DC
  Administered 2021-04-30 – 2021-05-01 (×4): 5000 [IU] via SUBCUTANEOUS
  Filled 2021-04-30 (×4): qty 1

## 2021-04-30 MED ORDER — CEFTRIAXONE SODIUM 2 G IJ SOLR
2.0000 g | Freq: Once | INTRAMUSCULAR | Status: AC
  Administered 2021-04-30: 2 g via INTRAVENOUS
  Filled 2021-04-30: qty 20

## 2021-04-30 MED ORDER — NADOLOL 40 MG PO TABS
40.0000 mg | ORAL_TABLET | Freq: Two times a day (BID) | ORAL | Status: DC
  Administered 2021-04-30 – 2021-05-01 (×3): 40 mg via ORAL
  Filled 2021-04-30 (×3): qty 1

## 2021-04-30 MED ORDER — SODIUM CHLORIDE 0.9% FLUSH
3.0000 mL | Freq: Two times a day (BID) | INTRAVENOUS | Status: DC
  Administered 2021-04-30 – 2021-05-01 (×2): 3 mL via INTRAVENOUS

## 2021-04-30 MED ORDER — NITROGLYCERIN 0.4 MG SL SUBL
0.4000 mg | SUBLINGUAL_TABLET | SUBLINGUAL | Status: DC | PRN
Start: 2021-04-30 — End: 2021-05-01

## 2021-04-30 MED ORDER — SODIUM BICARBONATE 650 MG PO TABS
650.0000 mg | ORAL_TABLET | Freq: Two times a day (BID) | ORAL | Status: DC
  Administered 2021-04-30 – 2021-05-01 (×3): 650 mg via ORAL
  Filled 2021-04-30 (×3): qty 1

## 2021-04-30 MED ORDER — SODIUM CHLORIDE 0.9 % IV SOLN
2.0000 g | INTRAVENOUS | Status: DC
  Administered 2021-05-01: 2 g via INTRAVENOUS
  Filled 2021-04-30: qty 2

## 2021-04-30 MED ORDER — ONDANSETRON HCL 4 MG/2ML IJ SOLN: 4.0000 mg | Freq: Four times a day (QID) | INTRAMUSCULAR | Status: DC | PRN

## 2021-04-30 MED ORDER — ROSUVASTATIN CALCIUM 10 MG PO TABS
10.0000 mg | ORAL_TABLET | Freq: Every day | ORAL | Status: DC
Start: 2021-04-30 — End: 2021-04-30

## 2021-04-30 MED ORDER — ONDANSETRON HCL 4 MG PO TABS: 4.0000 mg | ORAL_TABLET | Freq: Four times a day (QID) | ORAL | Status: DC | PRN

## 2021-04-30 MED ORDER — ACETAMINOPHEN 325 MG PO TABS
650.0000 mg | ORAL_TABLET | Freq: Four times a day (QID) | ORAL | Status: DC | PRN
  Administered 2021-05-01: 650 mg via ORAL
  Filled 2021-04-30: qty 2

## 2021-04-30 MED ORDER — ACETAMINOPHEN 650 MG RE SUPP: 650.0000 mg | Freq: Four times a day (QID) | RECTAL | Status: DC | PRN

## 2021-04-30 MED ORDER — NIRMATRELVIR/RITONAVIR (PAXLOVID) TABLET (RENAL DOSING): 2.0000 | ORAL_TABLET | Freq: Two times a day (BID) | ORAL | Status: DC

## 2021-04-30 MED ORDER — ASPIRIN EC 81 MG PO TBEC: 81.0000 mg | DELAYED_RELEASE_TABLET | Freq: Every day | ORAL | Status: DC

## 2021-04-30 MED ORDER — SODIUM CHLORIDE 0.9 % IV SOLN
500.0000 mg | INTRAVENOUS | Status: DC
  Administered 2021-05-01: 500 mg via INTRAVENOUS
  Filled 2021-04-30: qty 500

## 2021-04-30 MED ORDER — MAGNESIUM HYDROXIDE 400 MG/5ML PO SUSP: 30.0000 mL | Freq: Every day | ORAL | Status: DC | PRN

## 2021-04-30 MED ORDER — ASPIRIN EC 81 MG PO TBEC
81.0000 mg | DELAYED_RELEASE_TABLET | Freq: Every day | ORAL | Status: DC
  Administered 2021-05-01: 81 mg via ORAL
  Filled 2021-04-30: qty 1

## 2021-04-30 MED ORDER — SODIUM CHLORIDE 0.9 % IV SOLN
500.0000 mg | Freq: Once | INTRAVENOUS | Status: AC
  Administered 2021-04-30: 500 mg via INTRAVENOUS
  Filled 2021-04-30: qty 5

## 2021-04-30 MED ORDER — ROSUVASTATIN CALCIUM 10 MG PO TABS
20.0000 mg | ORAL_TABLET | Freq: Every day | ORAL | Status: DC
Start: 2021-04-30 — End: 2021-05-01
  Administered 2021-05-01: 20 mg via ORAL
  Filled 2021-04-30: qty 2

## 2021-04-30 MED ORDER — ASPIRIN 81 MG PO CHEW
324.0000 mg | CHEWABLE_TABLET | Freq: Once | ORAL | Status: AC
  Administered 2021-04-30: 324 mg via ORAL
  Filled 2021-04-30: qty 4

## 2021-04-30 MED ORDER — VITAMIN D3 25 MCG (1000 UNIT) PO TABS
2000.0000 [IU] | ORAL_TABLET | Freq: Every day | ORAL | Status: DC
  Administered 2021-05-01: 2000 [IU] via ORAL
  Filled 2021-04-30 (×2): qty 2

## 2021-04-30 MED ORDER — MORPHINE SULFATE (PF) 2 MG/ML IV SOLN: 2.0000 mg | INTRAVENOUS | Status: DC | PRN

## 2021-04-30 NOTE — Assessment & Plan Note (Addendum)
Follow-up with nephrology as outpatient.  Creatinine actually improved to 2.92 today.  GFR still low at 16. ?

## 2021-04-30 NOTE — Assessment & Plan Note (Addendum)
COVID-19 pneumonia.  Initial pulse ox 88% in the field.  With the patient's kidney function being impaired, we are unable to give Paxlovid.  Not a candidate for remdesivir at this point.  Not a candidate for steroids with resolved acute respiratory failure. ?

## 2021-04-30 NOTE — ED Notes (Signed)
Lunch tray placed at bedside at this time. No needs expressed to RN. Bed lowered and locked. Call light within reach. Daughter at bedside. ?

## 2021-04-30 NOTE — Evaluation (Signed)
Physical Therapy Evaluation ?Patient Details ?Name: Kristina Mitchell ?MRN: [id number] ?DOB: 10/06/1945 ?Today's Date: 04/30/2021 ? ?History of Present Illness ? 76 y.o. female with medical history significant for hypertension, GERD, IBS, stage 5 CKD, dyslipidemia who presents to the ER for evaluation of generalized weakness.  Patient states that she has had a cough.  she has been weak for the last few days and had a fall leading to coming to the ED.  ?Clinical Impression ? Pt did well with bed mobility, ambulation w/o AD and generally showed good confidence and safety.  Difficult to get consistent SpO2 but appeared to be in the high 90s t/o the session even with activity.  Pt displayed some minimal balance deficits with dynamic challenges but ultimately showed the ability to safely return home when medically ready.  She does live in a second floor apartment, per today's performance she likely could manage 20+ steps but would like to trial steps before d/c to insure she is able to safely meet that challenge.  ?   ? ?Recommendations for follow up therapy are one component of a multi-disciplinary discharge planning process, led by the attending physician.  Recommendations may be updated based on patient status, additional functional criteria and insurance authorization. ? ?Follow Up Recommendations No PT follow up (will maintain on caseload to trial steps and maximize activity while here) ? ?  ?Assistance Recommended at Discharge Intermittent Supervision/Assistance  ?Patient can return home with the following ?   ? ?  ?Equipment Recommendations None recommended by PT  ?Recommendations for Other Services ?    ?  ?Functional Status Assessment Patient has had a recent decline in their functional status and demonstrates the ability to make significant improvements in function in a reasonable and predictable amount of time.  ? ?  ?Precautions / Restrictions Precautions ?Precautions: Fall ?Restrictions ?Weight Bearing  Restrictions: No  ? ?  ? ?Mobility ? Bed Mobility ?Overal bed mobility: Independent ?  ?  ?  ?  ?  ?  ?General bed mobility comments: Pt easily able to get herself up to sitting w/o assist ?  ? ?Transfers ?Overall transfer level: Independent ?Equipment used: None ?  ?  ?  ?  ?  ?  ?  ?General transfer comment: Pt able to rise to standing w/o AD, w/o assist and w/o safety concerns - good confidence and stability ?  ? ?Ambulation/Gait ?Ambulation/Gait assistance: Independent ?Gait Distance (Feet): 100 Feet ?Assistive device: None ?  ?  ?  ?  ?General Gait Details: multiple small loops in room w/o issue.  She had good confidence w/o UEs/AD, displayed minimal fatigue with the effort and had no LOBs or overt safety concerns ? ?Stairs ?  ?  ?  ?  ?  ? ?Wheelchair Mobility ?  ? ?Modified Rankin (Stroke Patients Only) ?  ? ?  ? ?Balance Overall balance assessment: Modified Independent ?  ?  ?  ?  ?  ?  ?  ?  ?  ?  ?  ?  ?  ?  ?  ?  ?  ?  ?   ? ? ? ?Pertinent Vitals/Pain Pain Assessment ?Pain Assessment: No/denies pain  ? ? ?Home Living Family/patient expects to be discharged to:: Private residence ?Living Arrangements: Children ?Available Help at Discharge: Available 24 hours/day ?Type of Home: Apartment ?Home Access: Stairs to enter ?Entrance Stairs-Rails: Right ?Entrance Stairs-Number of Steps: 20 ?  ?Home Layout: One level ?Home Equipment: Rollator (4 wheels);Rolling Walker (2 wheels) ?  Additional Comments: Pt lives on second floor apt with no elevator  ?  ?Prior Function Prior Level of Function : Independent/Modified Independent ?  ?  ?  ?  ?  ?  ?Mobility Comments: Pt minimally out of the home but up ad lib w/o AD in the home ?ADLs Comments: Pt does laundry, bathing, etc but does have help with heavier house hold errands ?  ? ? ?Hand Dominance  ?   ? ?  ?Extremity/Trunk Assessment  ? Upper Extremity Assessment ?Upper Extremity Assessment: Generalized weakness;Overall [facility] for tasks assessed (chronic b/l shoulder issues,  no elevation >90 b/l) ?  ? ?Lower Extremity Assessment ?Lower Extremity Assessment: Overall WFL for tasks assessed ?  ? ?   ?Communication  ? Communication: No difficulties  ?Cognition Arousal/Alertness: Awake/alert ?Behavior During Therapy: [facility] for tasks assessed/performed ?Overall Cognitive Status: Within Functional Limits for tasks assessed ?  ?  ?  ?  ?  ?  ?  ?  ?  ?  ?  ?  ?  ?  ?  ?  ?  ?  ?  ? ?  ?General Comments   ? ?  ?Exercises    ? ?Assessment/Plan  ?  ?PT Assessment Patient needs continued PT services  ?PT Problem List   ? ?   ?  ?PT Treatment Interventions Gait training;Stair training;Functional mobility training;Therapeutic activities;Therapeutic exercise;Balance training;Neuromuscular re-education;Patient/family education   ? ?PT Goals (Current goals can be found in the Care Plan section)  ?Acute Rehab PT Goals ?Patient Stated Goal: go home soon ?PT Goal Formulation: With patient ?Time For Goal Achievement: 05/14/21 ?Potential to Achieve Goals: Good ? ?  ?Frequency Min 2X/week ?  ? ? ?Co-evaluation   ?  ?  ?  ?  ? ? ?  ?AM-PAC PT "6 Clicks" Mobility  ?Outcome Measure Help needed turning from your back to your side while in a flat bed without using bedrails?: None ?Help needed moving from lying on your back to sitting on the side of a flat bed without using bedrails?: None ?Help needed moving to and from a bed to a chair (including a wheelchair)?: None ?Help needed standing up from a chair using your arms (e.g., wheelchair or bedside chair)?: None ?Help needed to walk in hospital room?: A Little ?Help needed climbing 3-5 steps with a railing? : A Little ?6 Click Score: 22 ? ?  ?End of Session   ?Activity Tolerance: Patient tolerated treatment well ?Patient left: in bed;with call bell/phone within reach;with family/visitor present ?Nurse Communication: Mobility status ?PT Visit Diagnosis: Muscle weakness (generalized) (M62.81);History of falling (Z91.81) ?  ? ?Time: 8466-5993 ?PT Time Calculation  (min) (ACUTE ONLY): 28 min ? ? ?Charges:   PT Evaluation ?$PT Eval Low Complexity: 1 Low ?PT Treatments ?$Therapeutic Activity: 8-22 mins ?  ?   ? ? ?Kreg Shropshire, DPT ?04/30/2021, 4:20 PM ? ?

## 2021-04-30 NOTE — Assessment & Plan Note (Addendum)
Demand ischemia from acute respiratory failure and pneumonia. ? ? ?

## 2021-04-30 NOTE — Assessment & Plan Note (Addendum)
Did well with PT and no further recommendations needed ?

## 2021-04-30 NOTE — Progress Notes (Signed)
Admission profile updated. ?

## 2021-04-30 NOTE — Assessment & Plan Note (Signed)
Blood pressure is stable ?Continue nadolol ?Hold amlodipine for now ?
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
## 2021-04-30 NOTE — ED Notes (Signed)
Patient given water. Daughter at bedside. No needs expressed to RN at this time. ?

## 2021-04-30 NOTE — Assessment & Plan Note (Addendum)
Patient was given 2 days of Rocephin and Zithromax here in the hospital.  Switched over to Ophir and Zithromax to complete a 5-day course.  PSI 97 on admission which is stage IV pneumonia with a 9.3% mortality. ?

## 2021-04-30 NOTE — Assessment & Plan Note (Addendum)
Last hemoglobin 9.3 ?
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
## 2021-04-30 NOTE — ED Notes (Signed)
Pt transported to CT ?

## 2021-04-30 NOTE — Progress Notes (Signed)
*  PRELIMINARY RESULTS* ?Echocardiogram ?2D Echocardiogram has been performed. ? ?Kristina Mitchell ?04/30/2021, 11:45 AM ?

## 2021-04-30 NOTE — H&P (Addendum)
?History and Physical  ? ? ?Patient: Kristina Mitchell VXB:[id number] DOB: 06/29/1945 ?DOA: 04/30/2021 ?DOS: the patient was seen and examined on 04/30/2021 ?PCP: [name], MD  ?Patient coming from: Home ? ?Chief Complaint:  ?Chief Complaint  ?Patient presents with  ? Weakness  ? ?HPI: Kristina Mitchell is a 76 y.o. female with medical history significant for hypertension, GERD, IBS, stage 5 CKD, dyslipidemia who presents to the ER for evaluation of generalized weakness. ?Patient states that she has had a cough but is unable to expectorate phlegm associated with nausea and some emesis.  She also complains of generalized weakness and was unable to get out of bed so she slid down to the ground.  She was unable to get up on her own and her daughter was unable to assist her so they called EMS.  When EMS arrived she was found to have room air pulse oximetry of 88%.  They administered bronchodilator therapy and Solu-Medrol. ?Patient denies having any fever or chills, she has shortness of breath with exertion but that is unchanged.  She denies having any chest pain, no dizziness, no lightheadedness, no abdominal pain, no changes in her bowel habits, no headache, no dizziness, no lightheadedness. ?She denies having any sick contacts.  She received 2 doses of the COVID-19 vaccine and 1 booster shot. ?Her COVID-19 PCR test is positive ?Review of Systems: As mentioned in the history of present illness. All other systems reviewed and are negative. ?Past Medical History:  ?Diagnosis Date  ? 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 GERD (gastroesophageal reflux disease)   ? Headache   ? OCCASIONAL-MED RELATED  ? Heart valve problem   ? "TOLD HAS A LEAKY HEART VALVE" JUST WATCHING/ DR Mickel Baas [name]  ? Hyperlipidemia 09/12/2014  ? Hypertension 09/12/2014  ? IBS (irritable bowel syndrome) 09/12/2014  ? Kidney damage   ? HX OF WITH YEARS OF ALEVE, DISCONTINUED ALEVE  ? Neuromuscular disorder ([place])   ? NUMBNESS IN FINGERS,NERVES IN SHOULDERS ISSUE DUE  TO HORSEBACK ACCIDENT WHEN 12 YS OLD, GETS CORTISONE SHOTS  ? Shortness of breath dyspnea   ? ?Past Surgical History:  ?Procedure Laterality Date  ? CHOLECYSTECTOMY    ? COLONOSCOPY WITH PROPOFOL N/A 09/21/2014  ? Procedure: COLONOSCOPY WITH PROPOFOL;  Surgeon: Lucilla Lame, MD;  Location: [facility];  Service: Endoscopy;  Laterality: N/A;  ? JOINT REPLACEMENT Bilateral 2011  ? KNEES  ? POLYPECTOMY  09/21/2014  ? Procedure: POLYPECTOMY;  Surgeon: Lucilla Lame, MD;  Location: [facility];  Service: Endoscopy;;  ? TUBAL LIGATION    ? ?Social History:  reports that she has quit smoking. She has a 90.00 pack-year smoking history. She has never used smokeless tobacco. She reports current alcohol use of about 7.0 standard drinks per week. She reports that she does not use drugs. ? ?Allergies  ?Allergen Reactions  ? Sulfa Antibiotics Hives and Swelling  ? Benazepril Rash  ? ? ?Family History  ?Problem Relation Age of Onset  ? Heart failure Father   ? Diverticulitis Father   ? Stroke Mother   ? Cancer Mother   ?     Uterine  ? Diverticulitis Brother   ? Bladder Cancer Neg Hx   ? Kidney cancer Neg Hx   ? ? ?Prior to Admission medications   ?Medication Sig Start Date End Date Taking? Authorizing Provider  ?amLODipine (NORVASC) 10 MG tablet Take 10 mg by mouth daily.  07/31/14  Yes [provider]  ?  Cholecalciferol 50 MCG (2000 UT) CAPS Take by mouth.   Yes [provider]  ?imipramine (TOFRANIL) 25 MG tablet Take 25 mg by mouth every evening. 07/10/14  Yes [provider]  ?nadolol (CORGARD) 40 MG tablet Take 40 mg by mouth 2 (two) times daily.  07/25/14  Yes [provider]  ?rosuvastatin (CRESTOR) 10 MG tablet Take 10 mg by mouth daily.   Yes [provider]  ?sodium bicarbonate 650 MG tablet Take 1 tablet (650 mg total) by mouth 2 (two) times daily. 04/08/21  Yes Lorella Nimrod, MD  ?acetaminophen (TYLENOL) 500 MG tablet Take 500 mg by mouth every 6 (six) hours as needed  for fever, headache, moderate pain or mild pain.    [provider]  ?aspirin EC 81 MG EC tablet Take 1 tablet (81 mg total) by mouth daily. ?Patient not taking: Reported on 04/30/2021 10/07/14   Fritzi Mandes, MD  ? ? ?Physical Exam: ?Vitals:  ? 04/30/21 0630 04/30/21 0730 04/30/21 0800 04/30/21 0900  ?BP: (!) 112/43 123/62 (!) 109/91 (!) 114/53  ?Pulse: 60 77 71 69  ?Resp: 16 15 (!) 26 16  ?Temp:      ?TempSrc:      ?SpO2: 96% 98% 99% 100%  ?Weight:      ?Height:      ? ?Physical Exam ?Vitals and nursing note reviewed.  ?Constitutional:   ?   Appearance: Normal appearance.  ?HENT:  ?   Head: Normocephalic and atraumatic.  ?   Nose: Nose normal.  ?   Mouth/Throat:  ?   Mouth: Mucous membranes are moist.  ?Eyes:  ?   Comments: Pale conjunctiva  ?Cardiovascular:  ?   Rate and Rhythm: Normal rate and regular rhythm.  ?Pulmonary:  ?   Effort: Pulmonary effort is normal.  ?   Breath sounds: Rhonchi present.  ?   Comments: Right base ?Abdominal:  ?   General: Abdomen is flat. Bowel sounds are normal.  ?   Palpations: Abdomen is soft.  ?Musculoskeletal:  ?   Cervical back: Normal range of motion and neck supple.  ?   Right lower leg: Edema present.  ?   Left lower leg: Edema present.  ?Skin: ?   General: Skin is warm and dry.  ?Neurological:  ?   General: No focal deficit present.  ?   Mental Status: She is alert and oriented to person, place, and time.  ?Psychiatric:     ?   Mood and Affect: Mood normal.     ?   Behavior: Behavior normal.  ? ? ?Data Reviewed: ?Relevant notes from primary care and specialist visits, past discharge summaries as available in EHR, including Care Everywhere. ?Prior diagnostic testing as pertinent to current admission diagnoses ?Updated medications and problem lists for reconciliation ?ED course, including vitals, labs, imaging, treatment and response to treatment ?Triage notes, nursing and pharmacy notes and ED provider's notes ?Notable results as noted in HPI ?Labs reviewed.  Troponin  40 >> 87 >> 76.  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 Moderate paraseptal and centrilobular emphysema with mild diffuse bronchial wall thickening, suggesting COPD. Three-vessel coronary atherosclerosis. Nonobstructing right nephrolithiasis. ?12-lead EKG reviewed by me shows normal sinus rhythm with nonspecific T wave changes ? ?There are no new results to review at this time.  ? ?Assessment and Plan: ?* CAP (community acquired pneumonia) ?Patient noted to have a right lower lobe pneumonia ?She has leukocytosis and elevated procalcitonin levels concerning for possible bacterial infection to rule out aspiration ?Continue antibiotic therapy with Rocephin and Zithromax ?Speech therapy consult for swallow function evaluation ?Continue oxygen supplementation to maintain pulse oximetry greater than 92% ? ?COVID-19 virus infection ?Patient's COVID-19 PCR test is positive ?She does have mild hypoxia with room air pulse oximetry of 88% at rest and is currently on 2 L. ?Not a candidate for Paxlovid due to chronic kidney disease stage V ?Patient received 2 doses of the COVID-19 vaccine and 1 booster shot ?Supportive care for now with antipyretics and antitussives as needed ? ?Weakness ?Most likely related to acute illness ?We will request PT evaluation ?Place patient on fall precautions ? ?CKD (chronic  kidney disease) stage 5, GFR less than 15 ml/min (HCC) ?Stable ?Avoid nephrotoxic agents ?Continue sodium bicarbonate tablets ?Optimize blood pressure control ?We will consult nephrology ? ?Hypertension

## 2021-04-30 NOTE — Progress Notes (Signed)
?[facility]  ?ROUNDING NOTE  ? ?Subjective:  ? ?Kristina Mitchell is a 76 year old female with past medical history including IBS, dyslipidemia, hypertension, and chronic kidney disease stage V.  Patient presents to the emergency department with complaints of generalized weakness.  Patient has been admitted for CAP (community acquired pneumonia) [J18.9] ? ?Patient is known to our practice and receives outpatient nephrology follow-up with dr Holley Raring.  Patient was last seen in office on 04/22/2021 for hospital follow-up.  Patient states generalized weakness began a couple days ago.  She was unable to get out of bed this morning which resulted in sliding to the floor.  The weakness prevented her from independently getting up.  Her daughter called EMS when she was unable to assist.  Also reports a nonproductive cough.  No active chest or abdominal pain.  Reports no headache, dizziness, lightheadedness.  States she is short of breath at baseline with activity. ? ?Labs on arrival showed troponin 65, BUN 39, and creatinine 3.32 with GFR 14.  Troponin 40 and white blood cells 11.2.  Respiratory panel negative for influenza but positive for COVID-19.  Chest x-ray showed right basilar atelectasis.  CT chest shows bronchitis or aspiration. ? ?Been consulted to manage chronic kidney disease ? ?Objective:  ?Vital signs in last 24 hours:  ?Temp:  [98.1 ?F (36.7 ?C)] 98.1 ?F (36.7 ?C) (04/25 1844) ?Pulse Rate:  [60-77] 74 (04/26 1400) ?Resp:  [11-26] 16 (04/26 1400) ?BP: (105-140)/(43-91) 125/55 (04/26 1400) ?SpO2:  [93 %-100 %] 100 % (04/26 1400) ?Weight:  [74.4 kg] 74.4 kg (04/25 1836) ? ?Weight change:  ?Filed Weights  ? 04/29/21 1836  ?Weight: 74.4 kg  ? ? ?Intake/Output: ?I/O last 3 completed shifts: ?In: [place] [IV [id number]] ?Out: -  ?  ?Intake/Output this shift: ? No intake/output data recorded. ? ?Physical Exam: ?General: NAD, resting  ?Head: Normocephalic, atraumatic. Moist oral mucosal membranes  ?Eyes:  Anicteric  ?Lungs:  Scattered rhonchi, [place] o2  ?Heart: Regular rate and rhythm  ?Abdomen:  Soft, nontender, nondistended  ?Extremities: 1+ peripheral edema.  ?Neurologic: Nonfocal, moving all four extremities  ?Skin: No lesions  ?Access: none  ? ? ?Basic Metabolic Panel: ?Recent Labs  ?Lab 04/29/21 ?1838 04/30/21 ?0428  ?NA 137 141  ?K 4.2 3.9  ?CL 103 109  ?CO2 24 22  ?GLUCOSE 165* 176*  ?BUN 39* 39*  ?CREATININE 3.32* 3.20*  ?CALCIUM 9.8 8.9  ? ? ?Liver Function Tests: ?No results for input(s): AST, ALT, ALKPHOS, BILITOT, PROT, ALBUMIN in the last 168 hours. ?No results for input(s): LIPASE, AMYLASE in the last 168 hours. ?No results for input(s): AMMONIA in the last 168 hours. ? ?CBC: ?Recent Labs  ?Lab 04/29/21 ?1838 04/30/21 ?0428  ?WBC 11.2* 8.9  ?HGB 10.5* 8.6*  ?HCT 33.6* 27.7*  ?MCV 96.0 97.5  ?PLT 214 177  ? ? ?Cardiac Enzymes: ?No results for input(s): CKTOTAL, CKMB, CKMBINDEX, TROPONINI in the last 168 hours. ? ?BNP: ?Invalid input(s): POCBNP ? ?CBG: ?No results for input(s): GLUCAP in the last 168 hours. ? ?Microbiology: ?Results for orders placed or performed during the hospital encounter of 04/30/21  ?Resp Panel by RT-PCR (Flu A&B, Covid) Nasopharyngeal Swab     Status: Abnormal  ? Collection Time: 04/30/21  1:06 AM  ? Specimen: Nasopharyngeal Swab; Nasopharyngeal(NP) swabs in vial transport medium  ?Result Value Ref Range Status  ? SARS Coronavirus 2 by RT PCR POSITIVE (A) NEGATIVE Final  ?  Comment: (NOTE) ?SARS-CoV-2 target nucleic acids are DETECTED. ? ?  The SARS-CoV-2 RNA is generally detectable in upper respiratory ?specimens during the acute phase of infection. Positive results are ?indicative of the presence of the identified virus, but do not rule ?out bacterial infection or co-infection with other pathogens not ?detected by the test. Clinical correlation with patient history and ?other diagnostic information is necessary to determine patient ?infection status. The expected result is  Negative. ? ?Fact Sheet for Patients: ?EntrepreneurPulse.com.au ? ?Fact Sheet for Healthcare Providers: ?IncredibleEmployment.be ? ?This test is not yet approved or cleared by the [place] FDA and  ?has been authorized for detection and/or diagnosis of SARS-CoV-2 by ?FDA under an Emergency Use Authorization (EUA).  This EUA will ?remain in effect (meaning this test can be used) for the duration of  ?the COVID-19 declaration under Section 564(b)(1) of the A ct, 21 ?U.S.C. section 360bbb-3(b)(1), unless the authorization is ?terminated or revoked sooner. ? ?  ? Influenza A by PCR NEGATIVE NEGATIVE Final  ? Influenza B by PCR NEGATIVE NEGATIVE Final  ?  Comment: (NOTE) ?The Xpert Xpress SARS-CoV-2/FLU/RSV plus assay is intended as an aid ?in the diagnosis of influenza from Nasopharyngeal swab specimens and ?should not be used as a sole basis for treatment. Nasal washings and ?aspirates are unacceptable for Xpert Xpress SARS-CoV-2/FLU/RSV ?testing. ? ?Fact Sheet for Patients: ?EntrepreneurPulse.com.au ? ?Fact Sheet for Healthcare Providers: ?IncredibleEmployment.be ? ?This test is not yet approved or cleared by the [place] FDA and ?has been authorized for detection and/or diagnosis of SARS-CoV-2 by ?FDA under an Emergency Use Authorization (EUA). This EUA will remain ?in effect (meaning this test can be used) for the duration of the ?COVID-19 declaration under Section 564(b)(1) of the Act, 21 U.S.C. ?section 360bbb-3(b)(1), unless the authorization is terminated or ?revoked. ? ?Performed at [facility], [place], ?[place] [address] ?  ?Blood culture (routine x 2)     Status: None (Preliminary result)  ? Collection Time: 04/30/21  1:06 AM  ? Specimen: BLOOD  ?Result Value Ref Range Status  ? Specimen Description BLOOD RIGHT ASSIST CONTROL  Final  ? Special Requests   Final  ?  BOTTLES DRAWN AEROBIC AND ANAEROBIC  Blood Culture adequate volume  ? Culture   Final  ?  NO GROWTH < 12 HOURS ?Performed at [facility], [address], [place], [place] [address] ?  ? Report Status PENDING  Incomplete  ?Blood culture (routine x 2)     Status: None (Preliminary result)  ? Collection Time: 04/30/21  1:06 AM  ? Specimen: BLOOD  ?Result Value Ref Range Status  ? Specimen Description BLOOD RIGHT AC  Final  ? Special Requests   Final  ?  BOTTLES DRAWN AEROBIC AND ANAEROBIC Blood Culture results may not be optimal due to an inadequate volume of blood received in culture bottles  ? Culture   Final  ?  NO GROWTH < 12 HOURS ?Performed at [facility], [address] [place], [place] [address] ?  ? Report Status PENDING  Incomplete  ? ? ?Coagulation Studies: ?No results for input(s): LABPROT, INR in the last 72 hours. ? ?Urinalysis: ?Recent Labs  ?  04/30/21 ?0106  ?COLORURINE YELLOW*  ?LABSPEC 1.010  ?PHURINE 6.0  ?GLUCOSEU NEGATIVE  ?HGBUR NEGATIVE  ?BILIRUBINUR NEGATIVE  ?KETONESUR NEGATIVE  ?PROTEINUR NEGATIVE  ?NITRITE NEGATIVE  ?LEUKOCYTESUR TRACE*  ?  ? ? ?Imaging: ?DG Chest 2 View ? ?Result Date: 04/29/2021 ?CLINICAL DATA:  Dyspnea EXAM: CHEST - 2 VIEW COMPARISON:  None. FINDINGS:  Minimal right basilar atelectasis or infiltrate. Lungs are otherwise clear. No pneumothorax or pleural effusion. Cardiac size is within normal limits. No acute bone abnormality. IMPRESSION: Minimal right basilar atelectasis or infiltrate. Electronically Signed   By: Fidela Salisbury M.D.   On: 04/29/2021 19:22  ? ?CT CHEST WO CONTRAST ? ?Result Date: 04/30/2021 ?CLINICAL DATA:  Pneumonia, complication suspected, xray done EXAM: CT CHEST WITHOUT CONTRAST TECHNIQUE: Multidetector CT imaging of the chest was performed following the standard protocol without IV contrast. RADIATION DOSE REDUCTION: This exam was performed according to the departmental dose-optimization program which includes automated exposure control, adjustment of the mA and/or  kV according to patient size and/or use of iterative reconstruction technique. COMPARISON:  Chest radiograph from one day prior. FINDINGS: Cardiovascular: Normal heart size. No significant pericardial effusion/thicken

## 2021-05-01 DIAGNOSIS — J9601 Acute respiratory failure with hypoxia: Secondary | ICD-10-CM | POA: Diagnosis not present

## 2021-05-01 DIAGNOSIS — I10 Essential (primary) hypertension: Secondary | ICD-10-CM

## 2021-05-01 DIAGNOSIS — U071 COVID-19: Secondary | ICD-10-CM | POA: Diagnosis not present

## 2021-05-01 DIAGNOSIS — J189 Pneumonia, unspecified organism: Secondary | ICD-10-CM | POA: Diagnosis not present

## 2021-05-01 DIAGNOSIS — N185 Chronic kidney disease, stage 5: Secondary | ICD-10-CM | POA: Diagnosis not present

## 2021-05-01 DIAGNOSIS — R531 Weakness: Secondary | ICD-10-CM

## 2021-05-01 DIAGNOSIS — R778 Other specified abnormalities of plasma proteins: Secondary | ICD-10-CM

## 2021-05-01 LAB — TROPONIN I (HIGH SENSITIVITY): Troponin I (High Sensitivity): 82 ng/L — ABNORMAL HIGH (ref <18)

## 2021-05-01 LAB — URINE CULTURE

## 2021-05-01 LAB — BASIC METABOLIC PANEL
Anion gap: 8 (ref 5–15)
BUN: 43 mg/dL — ABNORMAL HIGH (ref 8–23)
CO2: 21 mmol/L — ABNORMAL LOW (ref 22–32)
Calcium: 9 mg/dL (ref 8.9–10.3)
Chloride: 112 mmol/L — ABNORMAL HIGH (ref 98–111)
Creatinine, Ser: 2.92 mg/dL — ABNORMAL HIGH (ref 0.44–1.00)
GFR, Estimated: 16 mL/min — ABNORMAL LOW (ref >60)
Glucose, Bld: 112 mg/dL — ABNORMAL HIGH (ref 70–99)
Potassium: 3.7 mmol/L (ref 3.5–5.1)
Sodium: 141 mmol/L (ref 135–145)

## 2021-05-01 LAB — CBC
HCT: 28.8 % — ABNORMAL LOW (ref 36.0–46.0)
Hemoglobin: 9.3 g/dL — ABNORMAL LOW (ref 12.0–15.0)
MCH: 30.7 pg (ref 26.0–34.0)
MCHC: 32.3 g/dL (ref 30.0–36.0)
MCV: 95 fL (ref 80.0–100.0)
Platelets: 205 10*3/uL (ref 150–400)
RBC: 3.03 MIL/uL — ABNORMAL LOW (ref 3.87–5.11)
RDW: 12.6 % (ref 11.5–15.5)
WBC: 13.8 10*3/uL — ABNORMAL HIGH (ref 4.0–10.5)
nRBC: 0 % (ref 0.0–0.2)

## 2021-05-01 MED ORDER — AZITHROMYCIN 250 MG PO TABS
ORAL_TABLET | ORAL | 0 refills | Status: AC
Start: 2021-05-01 — End: ?

## 2021-05-01 MED ORDER — CEFDINIR 300 MG PO CAPS: 300.0000 mg | ORAL_CAPSULE | Freq: Every day | ORAL | 0 refills | Status: AC

## 2021-05-01 NOTE — Assessment & Plan Note (Signed)
Initial pulse ox of 88% by EMS.  Came in on 4 L of oxygen.  Upon discharge able to come off oxygen completely. ?

## 2021-05-01 NOTE — Progress Notes (Signed)
?[facility]  ?ROUNDING NOTE  ? ?Subjective:  ? ?Kristina Mitchell is a 76 year old female with past medical history including IBS, dyslipidemia, hypertension, and chronic kidney disease stage V.  Patient presents to the emergency department with complaints of generalized weakness.  Patient has been admitted for Elevated troponin [R77.8] ?CAP (community acquired pneumonia) [J18.9] ?Acute respiratory failure with hypoxia ([place]) [J96.01] ?Generalized weakness [R53.1] ?Community acquired pneumonia of right lower lobe of lung [J18.9] ?COVID [U07.1] ? ?Patient is known to our practice and receives outpatient nephrology follow-up with dr Holley Raring.  Patient was last seen in office on 04/22/2021 for hospital follow-up.   ? ?Update ?Patient sitting up in bed ?Denies pain and discomfort  ?Continues to complains of fatigue ?Weaned to room air ?Appetite slowly improving ? ?Creatinine 2.92 ? ? ?Objective:  ?Vital signs in last 24 hours:  ?Temp:  [98.1 ?F (36.7 ?C)-98.9 ?F (37.2 ?C)] 98.2 ?F (36.8 ?C) (04/27 9518) ?Pulse Rate:  [66-75] 66 (04/27 0838) ?Resp:  [15-18] 18 (04/27 8416) ?BP: (125-156)/(55-73) 138/73 (04/27 6063) ?SpO2:  [97 %-100 %] 97 % (04/27 0838) ? ?Weight change:  ?Filed Weights  ? 04/29/21 1836  ?Weight: 74.4 kg  ? ? ?Intake/Output: ?I/O last 3 completed shifts: ?In: [place] [IV [id number]:3557] ?Out: -  ?  ?Intake/Output this shift: ? Total I/O ?In: 480 [P.O.:480] ?Out: -  ? ?Physical Exam: ?General: NAD  ?Head: Normocephalic, atraumatic. Moist oral mucosal membranes  ?Eyes: Anicteric  ?Lungs:  Rhonchi, normal effort  ?Heart: Regular rate and rhythm  ?Abdomen:  Soft, nontender, nondistended  ?Extremities: 1+ peripheral edema.  ?Neurologic: Nonfocal, moving all four extremities  ?Skin: No lesions  ?Access: none  ? ? ?Basic Metabolic Panel: ?Recent Labs  ?Lab 04/29/21 ?1838 04/30/21 ?3220 05/01/21 ?2542  ?NA 137 141 141  ?K 4.2 3.9 3.7  ?CL 103 109 112*  ?CO2 24 22 21*  ?GLUCOSE 165* 176* 112*  ?BUN 39* 39*  43*  ?CREATININE 3.32* 3.20* 2.92*  ?CALCIUM 9.8 8.9 9.0  ? ? ? ?Liver Function Tests: ?No results for input(s): AST, ALT, ALKPHOS, BILITOT, PROT, ALBUMIN in the last 168 hours. ?No results for input(s): LIPASE, AMYLASE in the last 168 hours. ?No results for input(s): AMMONIA in the last 168 hours. ? ?CBC: ?Recent Labs  ?Lab 04/29/21 ?1838 04/30/21 ?7062 05/01/21 ?3762  ?WBC 11.2* 8.9 13.8*  ?HGB 10.5* 8.6* 9.3*  ?HCT 33.6* 27.7* 28.8*  ?MCV 96.0 97.5 95.0  ?PLT 214 177 205  ? ? ? ?Cardiac Enzymes: ?No results for input(s): CKTOTAL, CKMB, CKMBINDEX, TROPONINI in the last 168 hours. ? ?BNP: ?Invalid input(s): POCBNP ? ?CBG: ?No results for input(s): GLUCAP in the last 168 hours. ? ?Microbiology: ?Results for orders placed or performed during the hospital encounter of 04/30/21  ?Resp Panel by RT-PCR (Flu A&B, Covid) Nasopharyngeal Swab     Status: Abnormal  ? Collection Time: 04/30/21  1:06 AM  ? Specimen: Nasopharyngeal Swab; Nasopharyngeal(NP) swabs in vial transport medium  ?Result Value Ref Range Status  ? SARS Coronavirus 2 by RT PCR POSITIVE (A) NEGATIVE Final  ?  Comment: (NOTE) ?SARS-CoV-2 target nucleic acids are DETECTED. ? ?The SARS-CoV-2 RNA is generally detectable in upper respiratory ?specimens during the acute phase of infection. Positive results are ?indicative of the presence of the identified virus, but do not rule ?out bacterial infection or co-infection with other pathogens not ?detected by the test. Clinical correlation with patient history and ?other diagnostic information is necessary to determine patient ?infection status. The expected  result is Negative. ? ?Fact Sheet for Patients: ?EntrepreneurPulse.com.au ? ?Fact Sheet for Healthcare Providers: ?IncredibleEmployment.be ? ?This test is not yet approved or cleared by the [place] FDA and  ?has been authorized for detection and/or diagnosis of SARS-CoV-2 by ?FDA under an Emergency Use Authorization (EUA).   This EUA will ?remain in effect (meaning this test can be used) for the duration of  ?the COVID-19 declaration under Section 564(b)(1) of the A ct, 21 ?U.S.C. section 360bbb-3(b)(1), unless the authorization is ?terminated or revoked sooner. ? ?  ? Influenza A by PCR NEGATIVE NEGATIVE Final  ? Influenza B by PCR NEGATIVE NEGATIVE Final  ?  Comment: (NOTE) ?The Xpert Xpress SARS-CoV-2/FLU/RSV plus assay is intended as an aid ?in the diagnosis of influenza from Nasopharyngeal swab specimens and ?should not be used as a sole basis for treatment. Nasal washings and ?aspirates are unacceptable for Xpert Xpress SARS-CoV-2/FLU/RSV ?testing. ? ?Fact Sheet for Patients: ?EntrepreneurPulse.com.au ? ?Fact Sheet for Healthcare Providers: ?IncredibleEmployment.be ? ?This test is not yet approved or cleared by the [place] FDA and ?has been authorized for detection and/or diagnosis of SARS-CoV-2 by ?FDA under an Emergency Use Authorization (EUA). This EUA will remain ?in effect (meaning this test can be used) for the duration of the ?COVID-19 declaration under Section 564(b)(1) of the Act, 21 U.S.C. ?section 360bbb-3(b)(1), unless the authorization is terminated or ?revoked. ? ?Performed at [facility], [place], ?[place] [address] ?  ?Blood culture (routine x 2)     Status: None (Preliminary result)  ? Collection Time: 04/30/21  1:06 AM  ? Specimen: BLOOD  ?Result Value Ref Range Status  ? Specimen Description BLOOD RIGHT ASSIST CONTROL  Final  ? Special Requests   Final  ?  BOTTLES DRAWN AEROBIC AND ANAEROBIC Blood Culture adequate volume  ? Culture   Final  ?  NO GROWTH 1 DAY ?Performed at [facility], [address] [place], [place] [address] ?  ? Report Status PENDING  Incomplete  ?Blood culture (routine x 2)     Status: None (Preliminary result)  ? Collection Time: 04/30/21  1:06 AM  ? Specimen: BLOOD  ?Result Value Ref Range Status  ? Specimen  Description BLOOD RIGHT AC  Final  ? Special Requests   Final  ?  BOTTLES DRAWN AEROBIC AND ANAEROBIC Blood Culture results may not be optimal due to an inadequate volume of blood received in culture bottles  ? Culture   Final  ?  NO GROWTH 1 DAY ?Performed at [facility], [address], [place], [place] [address] ?  ? Report Status PENDING  Incomplete  ?Urine Culture     Status: Abnormal  ? Collection Time: 04/30/21  1:06 AM  ? Specimen: Urine, Clean Catch  ?Result Value Ref Range Status  ? Specimen Description   Final  ?  URINE, CLEAN CATCH ?Performed at [facility], [address] [place], [place] [address] ?  ? Special Requests   Final  ?  NONE ?Performed at [facility], [address], [place], [place] [address] ?  ? Culture MULTIPLE SPECIES PRESENT, SUGGEST RECOLLECTION (A)  Final  ? Report Status 05/01/2021 FINAL  Final  ? ? ?Coagulation Studies: ?No results for input(s): LABPROT, INR in the last 72 hours. ? ?Urinalysis: ?Recent Labs  ?  04/30/21 ?0106  ?COLORURINE YELLOW*  ?LABSPEC 1.010  ?PHURINE 6.0  ?GLUCOSEU NEGATIVE  ?HGBUR NEGATIVE  ?BILIRUBINUR NEGATIVE  ?KETONESUR NEGATIVE  ?PROTEINUR  NEGATIVE  ?NITRITE NEGATIVE  ?LEUKOCYTESUR TRACE*  ? ?  ? ? ?Imaging: ?DG Chest 2 View ? ?Result Date: 04/29/2021 ?CLINICAL DATA:  Dyspnea EXAM: CHEST - 2 VIEW COMPARISON:  None. FINDINGS: Minimal right basilar atelectasis or infiltrate. Lungs are otherwise clear. No pneumothorax or pleural effusion. Cardiac size is within normal limits. No acute bone abnormality. IMPRESSION: Minimal right basilar atelectasis or infiltrate. Electronically Signed   By: Fidela Salisbury M.D.   On: 04/29/2021 19:22  ? ?CT CHEST WO CONTRAST ? ?Result Date: 04/30/2021 ?CLINICAL DATA:  Pneumonia, complication suspected, xray done EXAM: CT CHEST WITHOUT CONTRAST TECHNIQUE: Multidetector CT imaging of the chest was performed following the standard protocol without IV contrast. RADIATION DOSE REDUCTION: This exam  was performed according to the departmental dose-optimization program which includes automated exposure control, adjustment of the mA and/or kV according to patient size and/or use of iterative reconstr

## 2021-05-01 NOTE — Discharge Summary (Signed)
?Physician Discharge Summary ?  ?Patient: Kristina Mitchell: [id number] DOB: September 02, 1945  ?Admit date:     04/30/2021  ?Discharge date: 05/01/21  ?Discharge Physician: Loletha Grayer  ? ?PCP: [name], MD  ? ?Recommendations at discharge:  ? ?Follow-up PCP 5 days ?Keep appointment with nephrology as outpatient ?Refer to pulmonary in about 3 weeks. ? ?Discharge Diagnoses: ?Principal Problem: ?  CAP (community acquired pneumonia) ?Active Problems: ?  COVID-19 virus infection ?  Acute respiratory failure with hypoxia ([place]) ?  Weakness ?  CKD (chronic kidney disease) stage 5, GFR less than 15 ml/min (H[facility]) ?  Hypertension ?  Anemia due to chronic kidney disease ?  Elevated troponin ? ? ? ?Hospital Course: ?The patient came to the emergency room on 04/29/2021.  She initially came in with weakness shortness of breath and cough.  Initial pulse ox by EMS was 88%.  The patient was admitted to the hospital with pneumonia and found to be COVID-positive.  The patient was started on antibiotics.  Her PSI score total was 97 making this class IV pneumonia with a mortality of 9.3% (age 102-10 equal 45 point; creatinine of 2.92 add 10 point; BUN greater than 30 add 20 points.  Total of 97 points).  The patient improved much quicker than expected and was stable to be discharged home on 05/01/2021.  Her pulse ox was 97% on room air upon discharge.  Less symptomatic.  Feeling stronger.  The patient was discharged home in stable condition. ? ?Assessment and Plan: ?* CAP (community acquired pneumonia) ?Patient was given 2 days of Rocephin and Zithromax here in the hospital.  Switched over to [place] and Zithromax to complete a 5-day course.  PSI 97 on admission which is stage IV pneumonia with a 9.3% mortality. ? ?Acute respiratory failure with hypoxia ([place]) ?Initial pulse ox of 88% by EMS.  Came in on 4 L of oxygen.  Upon discharge able to come off oxygen completely. ? ?COVID-19 virus infection ?COVID-19 pneumonia.  Initial pulse ox  88% in the field.  With the patient's kidney function being impaired, we are unable to give Paxlovid.  Not a candidate for remdesivir at this point.  Not a candidate for steroids with resolved acute respiratory failure. ? ?Weakness ?Did well with PT and no further recommendations needed ? ?CKD (chronic kidney disease) stage 5, GFR less than 15 ml/min (HCC) ?Follow-up with nephrology as outpatient.  Creatinine actually improved to 2.92 today.  GFR still low at 16. ? ?Hypertension ?Blood pressure is stable ?Continue nadolol ?Hold amlodipine for now ? ?Elevated troponin ?Demand ischemia from acute respiratory failure and pneumonia. ? ? ? ?Anemia due to chronic kidney disease ?Last hemoglobin 9.3 ? ? ? ? ?  ? ? ?Consultants: Pulmonary, nephrology ?Procedures performed: None ?Disposition: Home ?Diet recommendation:  ?Cardiac diet ?DISCHARGE MEDICATION: ?Allergies as of 05/01/2021   ? ?   Reactions  ? Sulfa Antibiotics Hives, Swelling  ? Benazepril Rash  ? ?  ? ?  ?Medication List  ?  ? ?STOP taking these medications   ? ?amLODipine 10 MG tablet ?Commonly known as: NORVASC ?  ?aspirin 81 MG EC tablet ?  ?imipramine 25 MG tablet ?Commonly known as: TOFRANIL ?  ? ?  ? ?TAKE these medications   ? ?acetaminophen 500 MG tablet ?Commonly known as: TYLENOL ?Take 500 mg by mouth every 6 (six) hours as needed for fever, headache, moderate pain or mild pain. ?  ?azithromycin 250 MG tablet ?Commonly known as:  Zithromax ?One tab po nightly for three days ?  ?cefdinir 300 MG capsule ?Commonly known as: OMNICEF ?Take 1 capsule (300 mg total) by mouth at bedtime for 3 days. ?  ?Cholecalciferol 50 MCG (2000 UT) Caps ?Take by mouth. ?  ?nadolol 40 MG tablet ?Commonly known as: CORGARD ?Take 40 mg by mouth 2 (two) times daily. ?  ?rosuvastatin 10 MG tablet ?Commonly known as: CRESTOR ?Take 10 mg by mouth daily. ?  ?sodium bicarbonate 650 MG tablet ?Take 1 tablet (650 mg total) by mouth 2 (two) times daily. ?  ? ?  ? ? Follow-up Information    ? ? [name], MD. Go on 05/15/2021.   ?Specialty: Family Medicine ?Why: Appointment at 11:30 am. ?Contact information: ?[address] ?[place] [address] ?[phone] ? ? ?  ?  ? ? Ottie Glazier, MD Follow up in 3 week(s).   ?Specialty: Pulmonary Disease ?Why: Called office and they requested patient call and make appointment since they are new.Patient needs to call and make follow up appointment 3 weeks from today. ?Contact information: ?[address] ?[place] [address] ?[phone] ? ? ?  ?  ? ?  ?  ? ?  ? ?Discharge Exam: ?Danley Danker Weights  ? 04/29/21 1836  ?Weight: 74.4 kg  ? ?Physical Exam ?HENT:  ?   Head: Normocephalic.  ?   Mouth/Throat:  ?   Pharynx: No oropharyngeal exudate.  ?Eyes:  ?   General: Lids are normal.  ?   Conjunctiva/sclera: Conjunctivae normal.  ?Cardiovascular:  ?   Rate and Rhythm: Normal rate and regular rhythm.  ?   Heart sounds: Normal heart sounds, S1 normal and S2 normal.  ?Pulmonary:  ?   Breath sounds: Examination of the right-lower field reveals decreased breath sounds. Examination of the left-lower field reveals decreased breath sounds. Decreased breath sounds present. No wheezing, rhonchi or rales.  ?Abdominal:  ?   Palpations: Abdomen is soft.  ?   Tenderness: There is no abdominal tenderness.  ?Musculoskeletal:  ?   Right lower leg: No swelling.  ?   Left lower leg: No swelling.  ?Skin: ?   General: Skin is warm.  ?   Findings: No rash.  ?Neurological:  ?   Mental Status: She is alert and oriented to person, place, and time.  ?  ? ?Condition at discharge: stable ? ?The results of significant diagnostics from this hospitalization (including imaging, microbiology, ancillary and laboratory) are listed below for reference.  ? ?Imaging Studies: ?DG Chest 2 View ? ?Result Date: 04/29/2021 ?CLINICAL DATA:  Dyspnea EXAM: CHEST - 2 VIEW COMPARISON:  None. FINDINGS: Minimal right basilar atelectasis or infiltrate. Lungs are otherwise clear. No pneumothorax or pleural  effusion. Cardiac size is within normal limits. No acute bone abnormality. IMPRESSION: Minimal right basilar atelectasis or infiltrate. Electronically Signed   By: Fidela Salisbury M.D.   On: 04/29/2021 19:22  ? ?CT CHEST WO CONTRAST ? ?Result Date: 04/30/2021 ?CLINICAL DATA:  Pneumonia, complication suspected, xray done EXAM: CT CHEST WITHOUT CONTRAST TECHNIQUE: Multidetector CT imaging of the chest was performed following the standard protocol without IV contrast. RADIATION DOSE REDUCTION: This exam was performed according to the departmental dose-optimization program which includes automated exposure control, adjustment of the mA and/or kV according to patient size and/or use of iterative reconstruction technique. COMPARISON:  Chest radiograph from one day prior. FINDINGS: Cardiovascular: Normal heart size. No significant pericardial effusion/thickening. Three-vessel coronary atherosclerosis. Atherosclerotic nonaneurysmal thoracic aorta. Normal caliber pulmonary arteries.  Mediastinum/Nodes: No discrete thyroid nodules. Unremarkable esophagus. No pathologically enlarged axillary, mediastinal or hilar lymph nodes, noting limited sensitivity for the detection of hilar adenopathy on this noncontrast study. Lungs/Pleura: No pneumothorax. No pleural effusion. Moderate paraseptal and centrilobular emphysema with mild diffuse bronchial wall thickening. No acute consolidative airspace disease or lung masses. Mild bandlike scarring versus atelectasis in the medial lingula. Mild-to-moderate patchy tree-in-bud opacities with associated mild cylindrical bronchiolectasis in the basilar right lower lobe. No significant pulmonary nodules. Upper abdomen: Cholecystectomy. Partially visualize simple 6.0 cm interpolar right renal cyst, for which no follow-up is recommended. Nonobstructing 6 mm interpolar right renal stone. Musculoskeletal: No aggressive appearing focal osseous lesions. Moderate thoracic spondylosis. Chronic appearing  mild-to-moderate L1 vertebral compression fracture. IMPRESSION: 1. Mild-to-moderate patchy tree-in-bud opacities with associated mild cylindrical bronchiolectasis in the basilar right lower lobe, compat
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
## 2021-05-01 NOTE — TOC Initial Note (Signed)
Transition of Care (TOC) - Initial/Assessment Note  ? ? ?Patient Details  ?Name: Kristina Mitchell ?MRN: [id number] ?Date of Birth: 11-09-1945 ? ?Transition of Care (TOC) CM/SW Contact:    ?Beverly Sessions, RN ?Phone Number: ?05/01/2021, 10:06 AM ? ?Clinical Narrative:                 ?.toc ? ?  ?  ? ? ?Patient Goals and CMS Choice ?  ?  ?  ? ?Expected Discharge Plan and Services ?  ?  ?  ?  ?  ?Expected Discharge Date: 05/01/21               ?  ?  ?  ?  ?  ?  ?  ?  ?  ?  ? ?Prior Living Arrangements/Services ?  ?  ?  ?       ?  ?  ?  ?  ? ?Activities of Daily Living ?Home Assistive Devices/Equipment: None ?ADL Screening (condition at time of admission) ?Patient's cognitive ability adequate to safely complete daily activities?: Yes ?Is the patient deaf or have difficulty hearing?: No ?Does the patient have difficulty seeing, even when wearing glasses/contacts?: No ?Does the patient have difficulty concentrating, remembering, or making decisions?: No ?Patient able to express need for assistance with ADLs?: Yes ?Does the patient have difficulty dressing or bathing?: No ?Independently performs ADLs?: Yes (appropriate for developmental age) ?Does the patient have difficulty walking or climbing stairs?: Yes ?Weakness of Legs: None ?Weakness of Arms/Hands: None ? ?Permission Sought/Granted ?  ?  ?   ?   ?   ?   ? ?Emotional Assessment ?  ?  ?  ?  ?  ?  ? ?Admission diagnosis:  Elevated troponin [R77.8] ?CAP (community acquired pneumonia) [J18.9] ?Acute respiratory failure with hypoxia ([place]) [J96.01] ?Generalized weakness [R53.1] ?Community acquired pneumonia of right lower lobe of lung [J18.9] ?COVID [U07.1] ?Patient Active Problem List  ? Diagnosis Date Noted  ? CAP (community acquired pneumonia) 04/30/2021  ? CKD (chronic kidney disease) stage 5, GFR less than 15 ml/min (HCC) 04/30/2021  ? COVID-19 virus infection 04/30/2021  ? Weakness 04/30/2021  ? Elevated troponin 04/30/2021  ? AKI (acute kidney injury) ([place])  04/05/2021  ? CKD (chronic kidney disease) stage 3, GFR 30-59 ml/min (HCC) 04/05/2021  ? GERD (gastroesophageal reflux disease)   ? Anemia due to chronic kidney disease   ? Left flank pain, chronic 02/05/2015  ? 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 Double vision with both eyes open 10/06/2014  ? Special screening for malignant neoplasms, colon   ? Benign neoplasm of ascending colon   ? Benign neoplasm of descending colon   ? Benign neoplasm of sigmoid colon   ? Hypertension 09/12/2014  ? Hyperlipidemia 09/12/2014  ? IBS (irritable bowel syndrome) 09/12/2014  ? ?PCP:  [name], MD ?Pharmacy:   ?[facility] [place] [facility] [place] [facility] ?[place] [address] ?Phone: [phone] Fax: [phone] ? ? ? ? ?Social Determinants of Health (SDOH) Interventions ?  ? ?Readmission Risk Interventions ?   ? View : No data to display.  ?  ?  ?  ? ? ? ?

## 2021-05-05 LAB — CULTURE, BLOOD (ROUTINE X 2)
Culture: NO GROWTH
Culture: NO GROWTH
Special Requests: ADEQUATE

## 2023-01-31 IMAGING — US US RENAL
1 series · 14 of 25 positions shown · non-contrast
Comparison: 08/10/2016

CLINICAL DATA: Acute kidney injury

EXAM:
RENAL / URINARY TRACT ULTRASOUND COMPLETE

[Series 1: us renal · 14 of 31 slices shown]
[im 1/31]
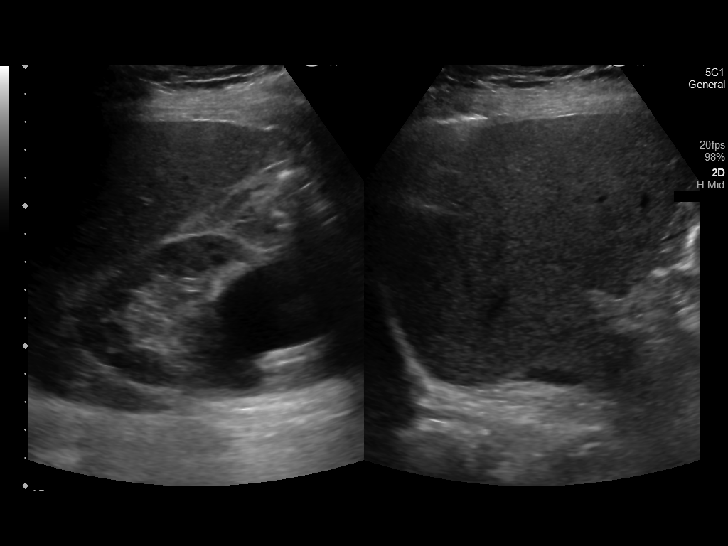
[im 3/31]
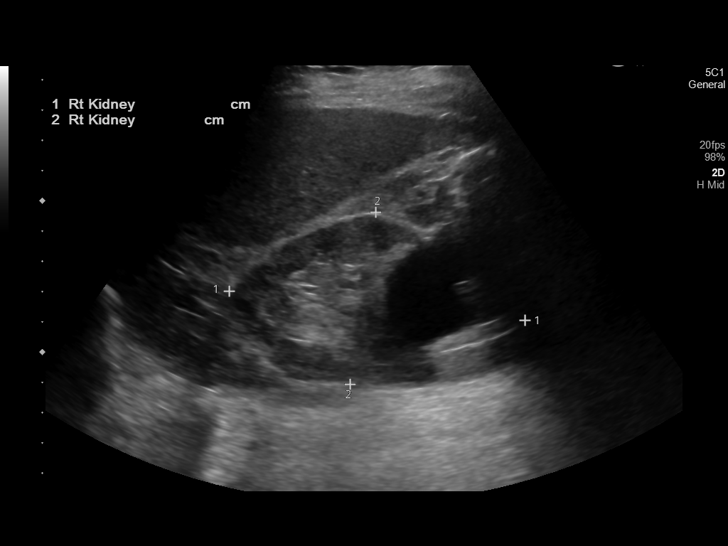
[im 6/31]
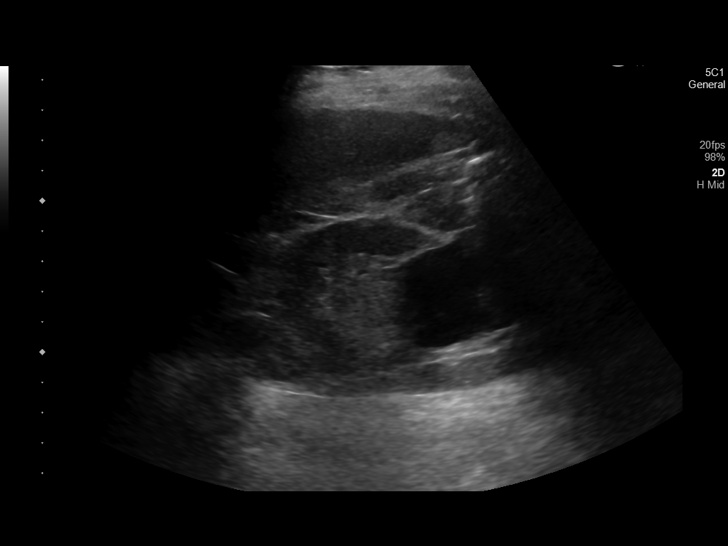
[im 8/31]
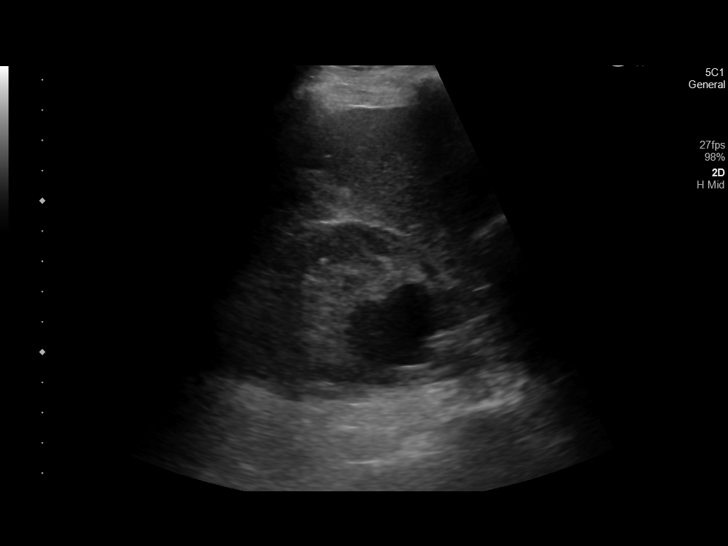
[im 11/31]
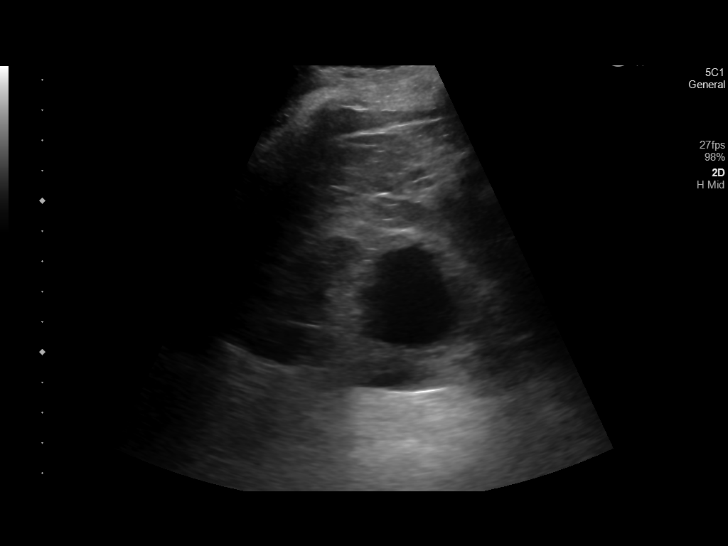
[im 12/31]
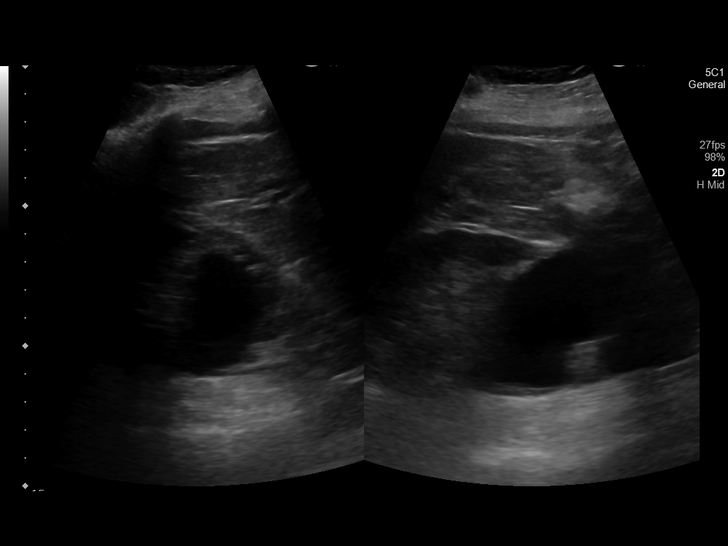
[im 14/31]
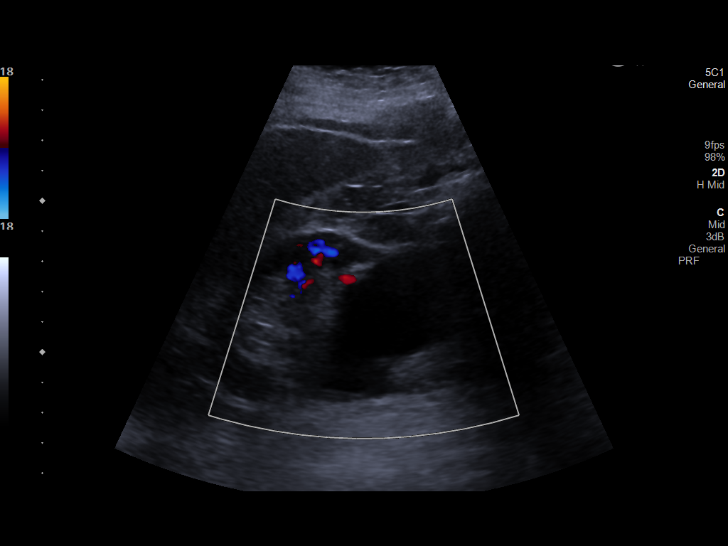
[im 17/31]
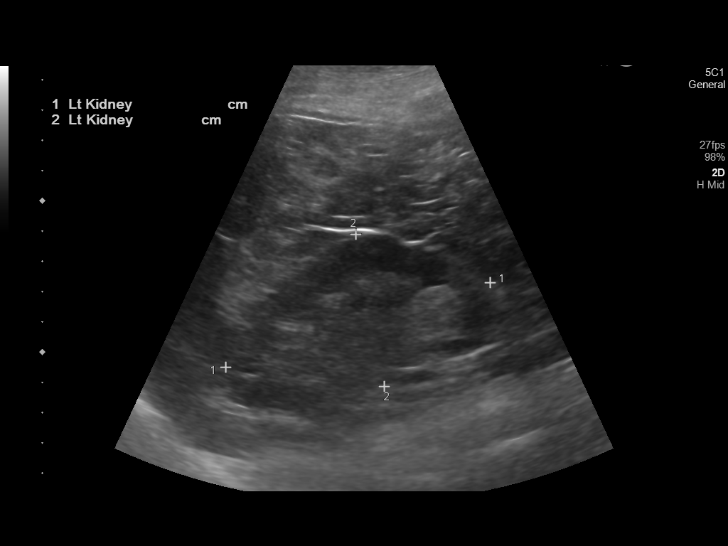
[im 19/31]
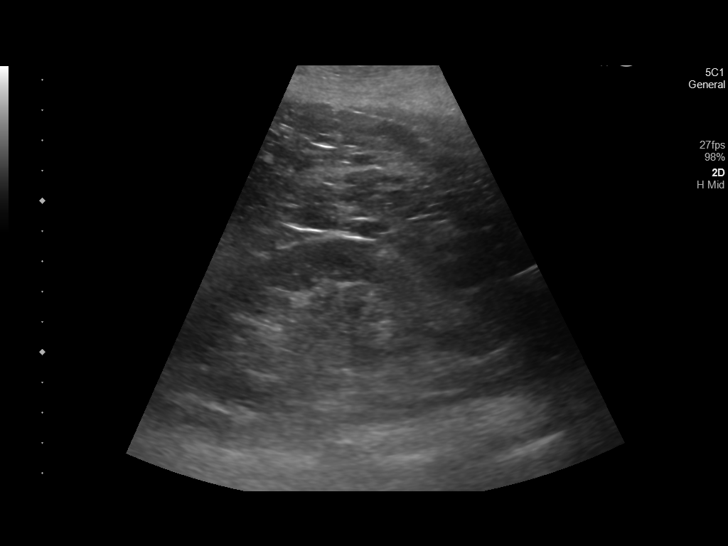
[im 21/31]
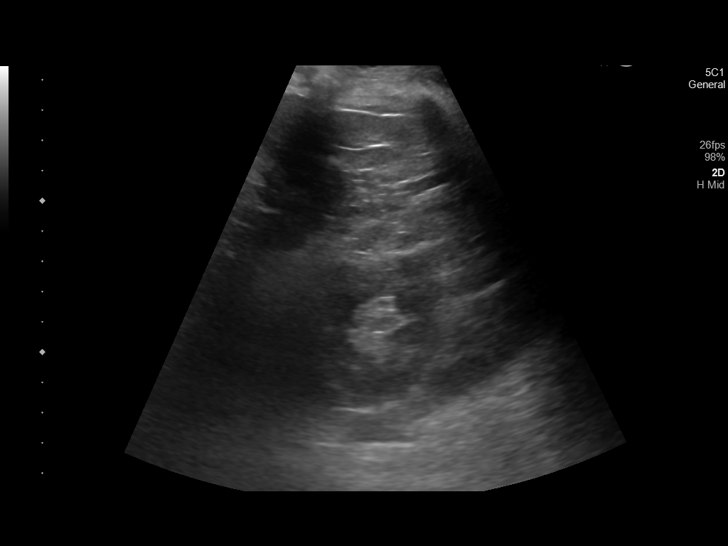
[im 23/31]
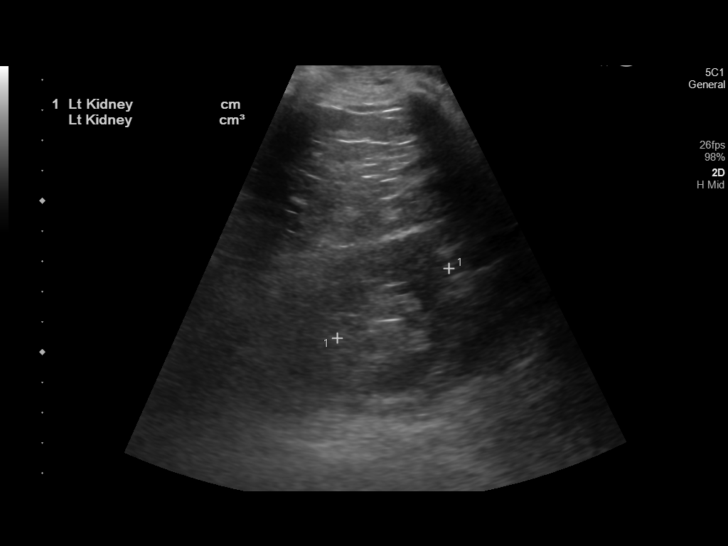
[im 26/31]
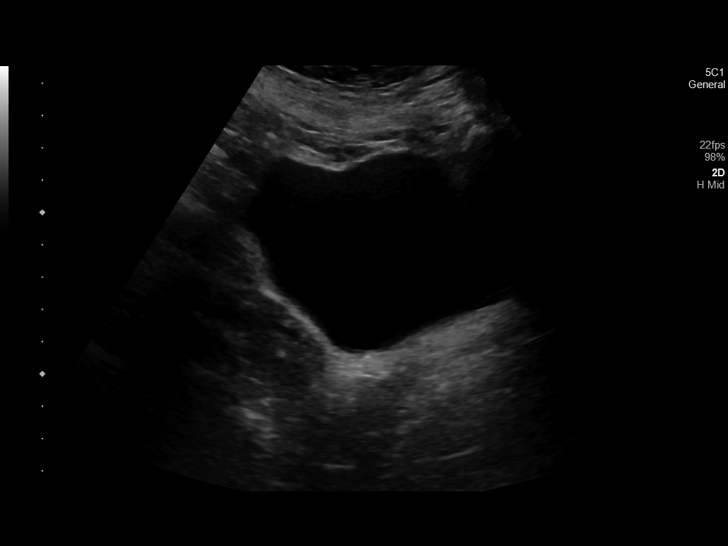
[im 28/31]
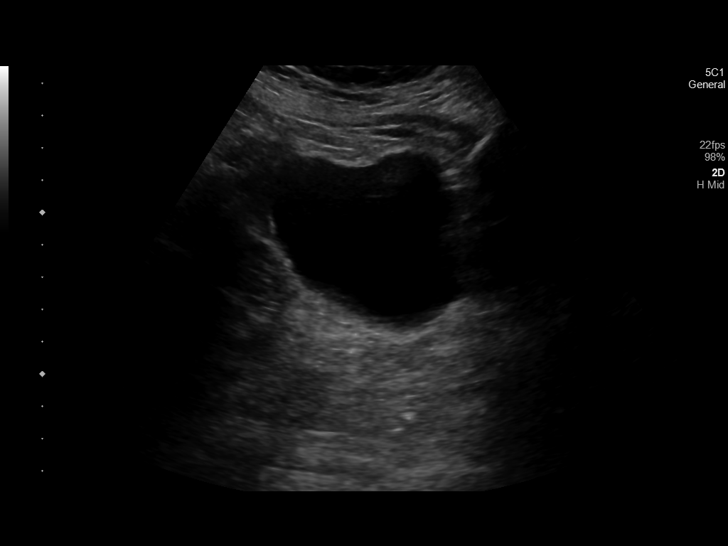
[im 31/31]
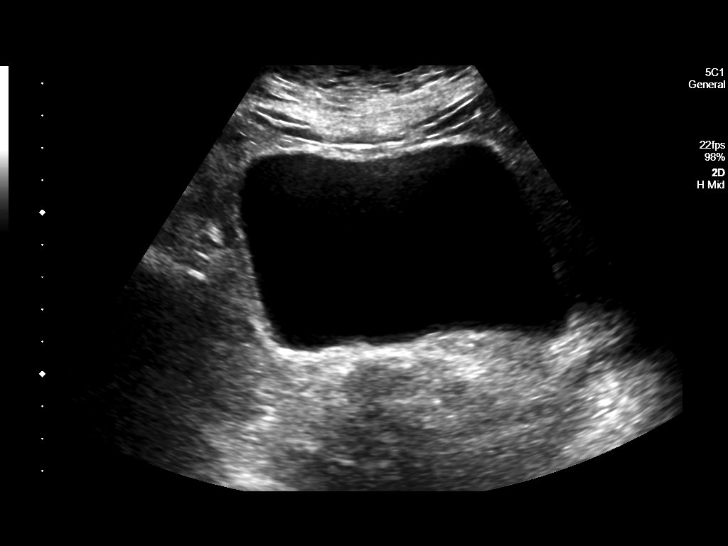

[14 of 25 positions shown; findings below may reference images not displayed]

FINDINGS: Right Kidney:

Renal measurements: 9.8 x 5.8 x 5.5 cm = volume: 163 mL.
Echogenicity within normal limits. Lower pole right renal cyst
measuring up to 5.0 cm. No shadowing stone or hydronephrosis
visualized.

Left Kidney:

Renal measurements: 9.2 x 5.1 x 4.4 cm = volume: 107 mL.
Echogenicity within normal limits. No mass, shadowing stone, or
hydronephrosis visualized.

Bladder:

Appears normal for degree of bladder distention.

Other:

None.
IMPRESSION: 1. No evidence of obstructive uropathy.
2. Right renal cyst.

## 2023-02-24 IMAGING — CR DG CHEST 2V
2 series · 2 of 2 positions shown · non-contrast
Comparison: None.

CLINICAL DATA: Dyspnea

EXAM:
CHEST - 2 VIEW

[chest lat]
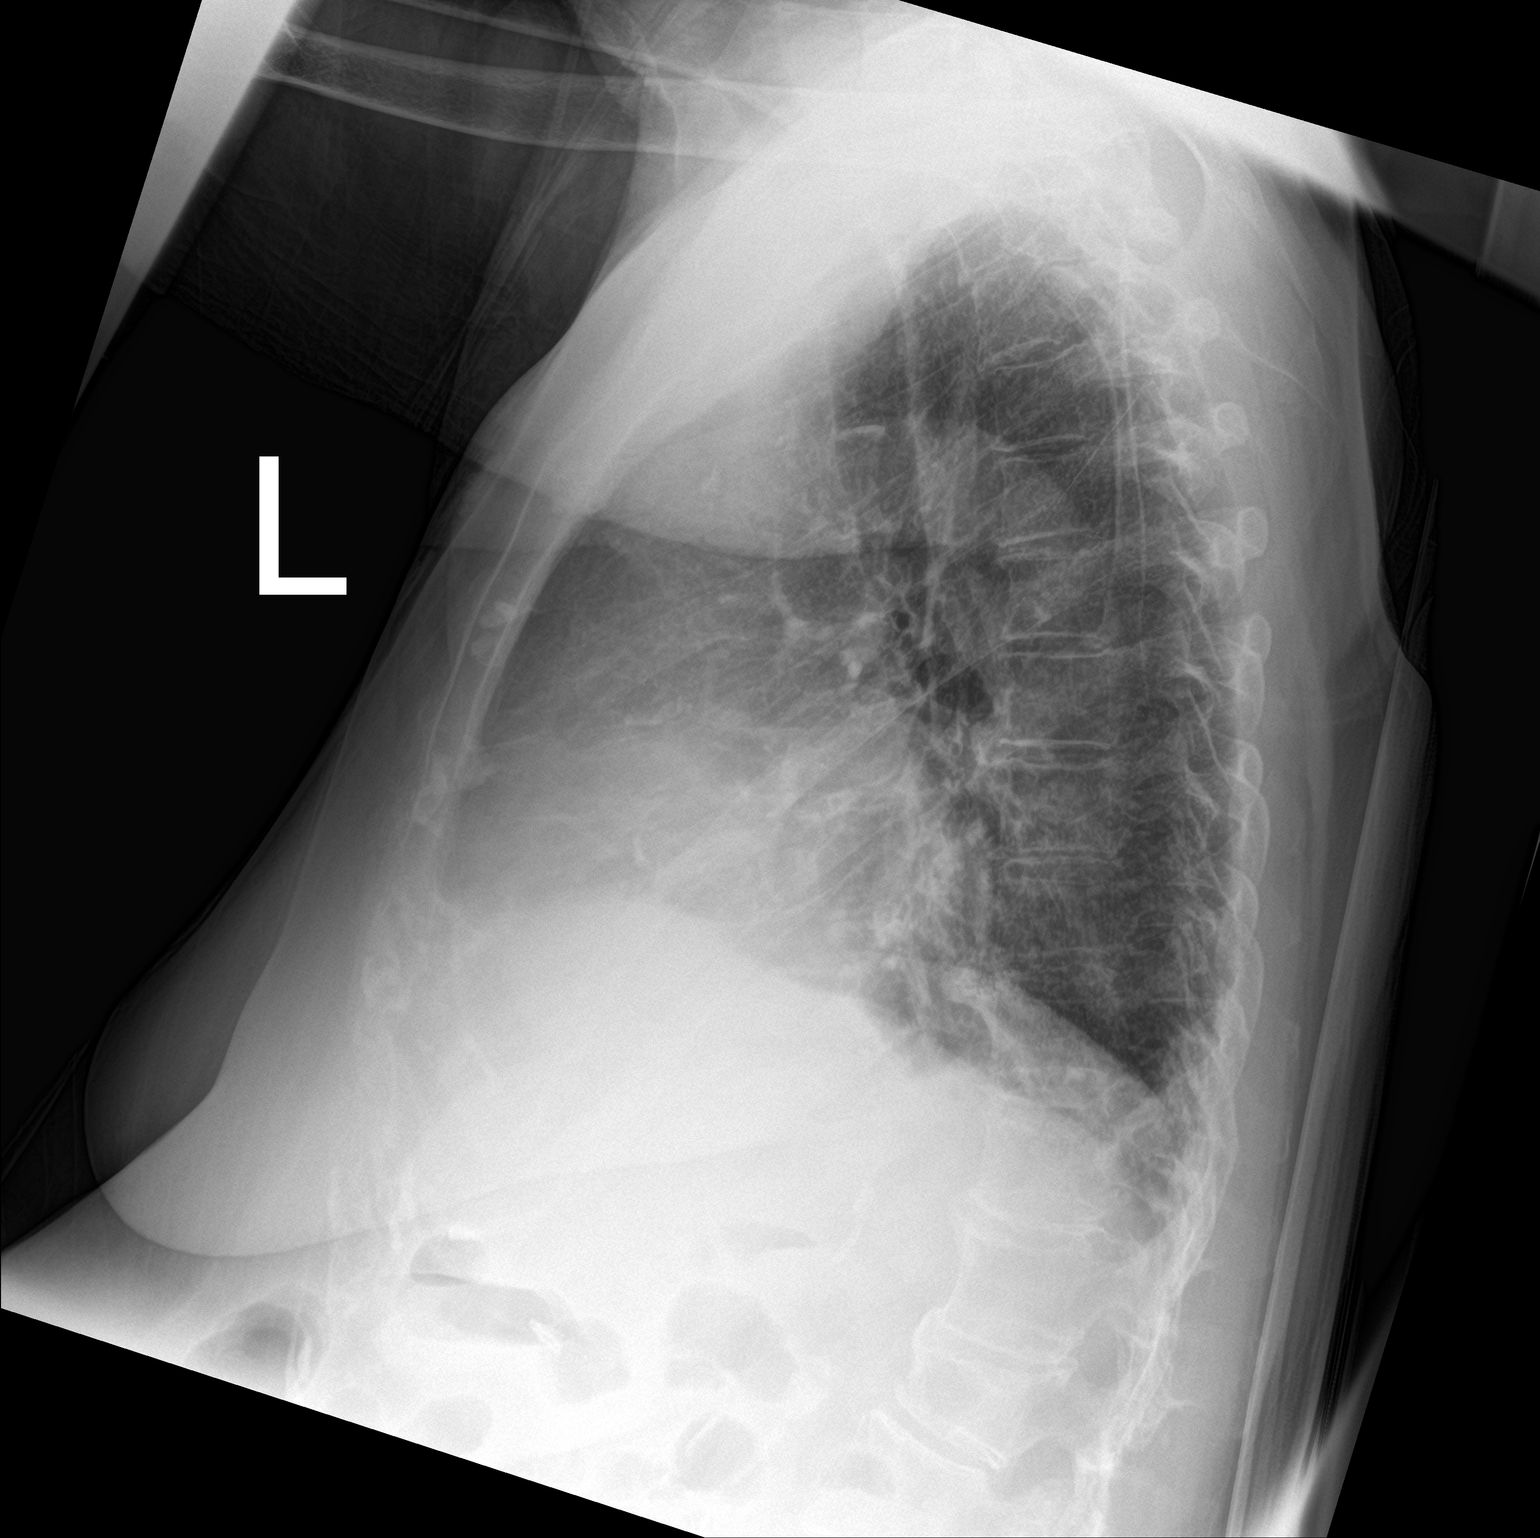

[chest ap]
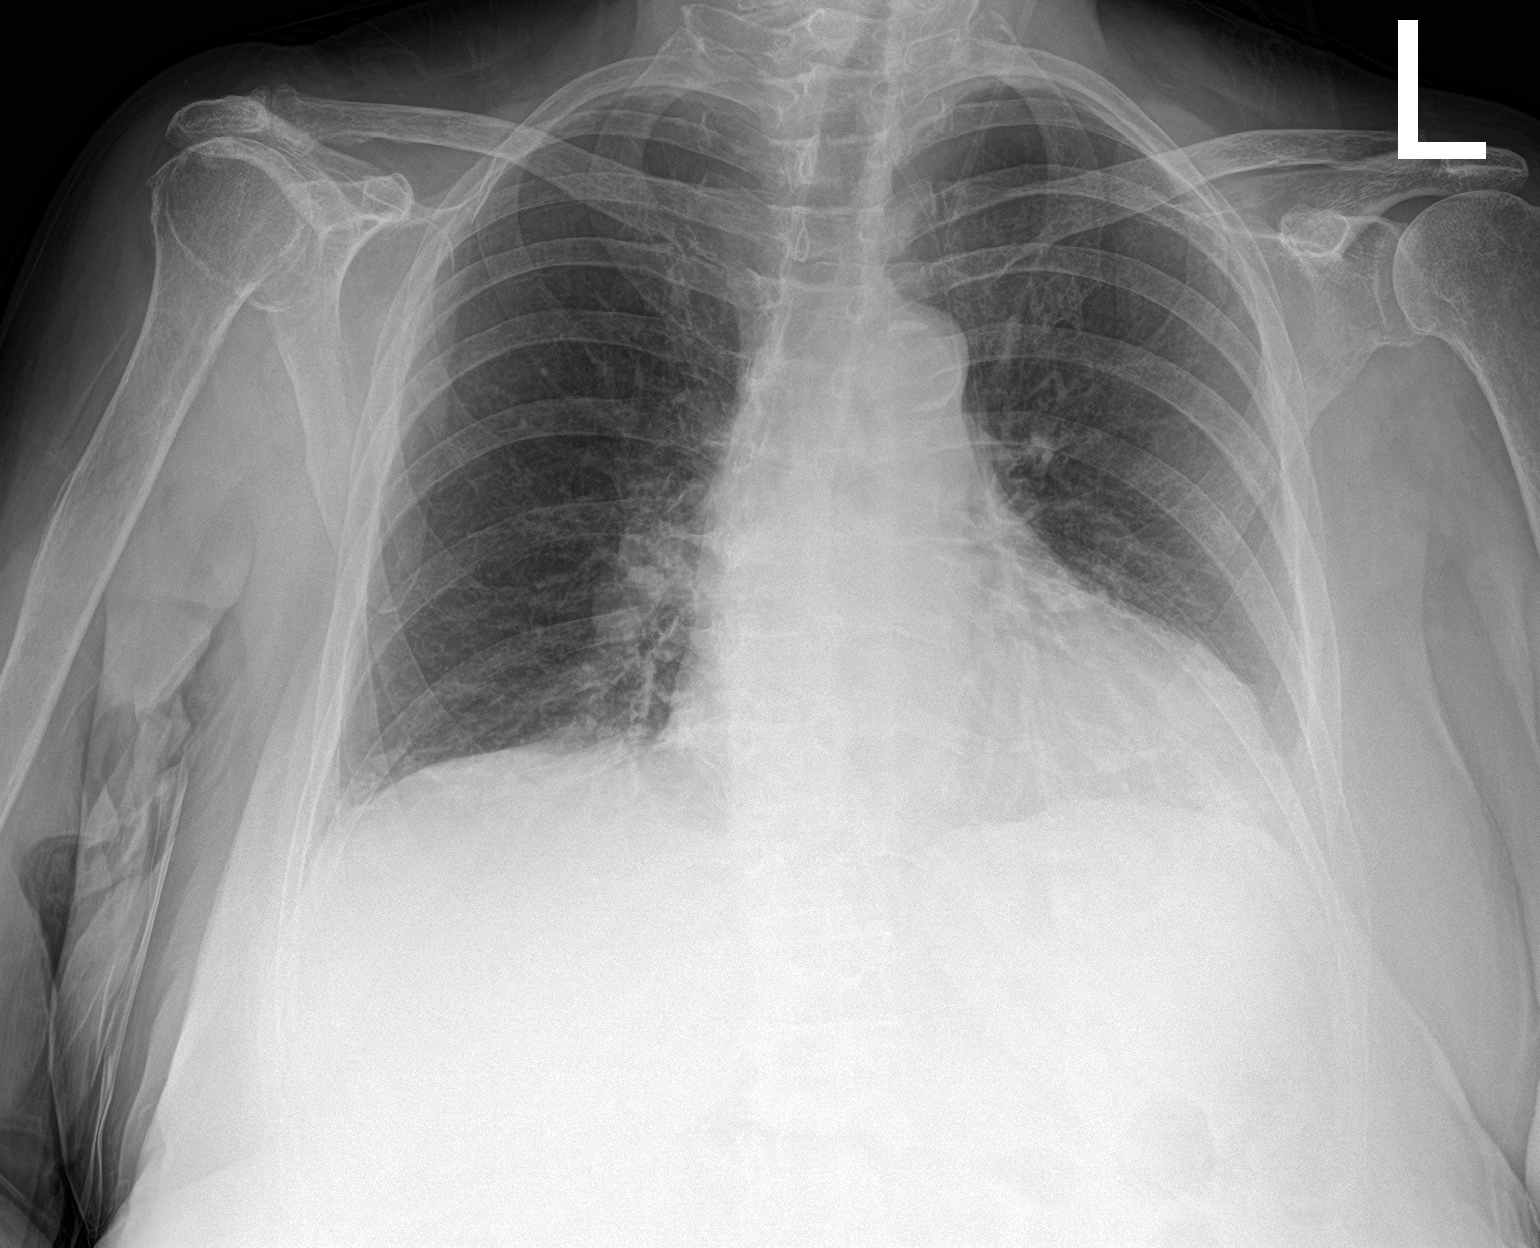

[2 of 2 positions shown; findings below may reference images not displayed]

FINDINGS: Minimal right basilar atelectasis or infiltrate. Lungs are otherwise
clear. No pneumothorax or pleural effusion. Cardiac size is within
normal limits. No acute bone abnormality.
IMPRESSION: Minimal right basilar atelectasis or infiltrate.

## 2023-12-07 ENCOUNTER — Emergency Department

## 2023-12-07 ENCOUNTER — Emergency Department
Admission: EM | Admit: 2023-12-07 | Discharge: 2023-12-07 | Disposition: A | Discharge status: Home / Self Care | Attending: Emergency Medicine | Admitting: Emergency Medicine

## 2023-12-07 ENCOUNTER — Other Ambulatory Visit: Payer: Self-pay

## 2023-12-07 DIAGNOSIS — N189 Chronic kidney disease, unspecified: Secondary | ICD-10-CM | POA: Diagnosis not present

## 2023-12-07 DIAGNOSIS — Y92009 Unspecified place in unspecified non-institutional (private) residence as the place of occurrence of the external cause: Secondary | ICD-10-CM | POA: Insufficient documentation

## 2023-12-07 DIAGNOSIS — F0390 Unspecified dementia without behavioral disturbance: Secondary | ICD-10-CM | POA: Insufficient documentation

## 2023-12-07 DIAGNOSIS — S5011XA Contusion of right forearm, initial encounter: Secondary | ICD-10-CM | POA: Insufficient documentation

## 2023-12-07 DIAGNOSIS — I129 Hypertensive chronic kidney disease with stage 1 through stage 4 chronic kidney disease, or unspecified chronic kidney disease: Secondary | ICD-10-CM | POA: Diagnosis not present

## 2023-12-07 DIAGNOSIS — D72829 Elevated white blood cell count, unspecified: Secondary | ICD-10-CM | POA: Diagnosis not present

## 2023-12-07 DIAGNOSIS — W1830XA Fall on same level, unspecified, initial encounter: Secondary | ICD-10-CM | POA: Insufficient documentation

## 2023-12-07 DIAGNOSIS — T07XXXA Unspecified multiple injuries, initial encounter: Secondary | ICD-10-CM

## 2023-12-07 DIAGNOSIS — W19XXXA Unspecified fall, initial encounter: Secondary | ICD-10-CM

## 2023-12-07 LAB — URINALYSIS, ROUTINE W REFLEX MICROSCOPIC
Bilirubin Urine: NEGATIVE
Glucose, UA: NEGATIVE mg/dL
Hgb urine dipstick: NEGATIVE
Ketones, ur: NEGATIVE mg/dL
Leukocytes,Ua: NEGATIVE
Nitrite: NEGATIVE
Protein, ur: NEGATIVE mg/dL
Specific Gravity, Urine: 1.013 (ref 1.005–1.030)
pH: 5 (ref 5.0–8.0)

## 2023-12-07 LAB — CBC WITH DIFFERENTIAL/PLATELET
Abs Immature Granulocytes: 0.06 K/uL (ref 0.00–0.07)
Basophils Absolute: 0.1 K/uL (ref 0.0–0.1)
Basophils Relative: 0 %
Eosinophils Absolute: 0.1 K/uL (ref 0.0–0.5)
Eosinophils Relative: 1 %
HCT: 41.3 % (ref 36.0–46.0)
Hemoglobin: 13.3 g/dL (ref 12.0–15.0)
Immature Granulocytes: 0 %
Lymphocytes Relative: 9 %
Lymphs Abs: 1.3 K/uL (ref 0.7–4.0)
MCH: 31.6 pg (ref 26.0–34.0)
MCHC: 32.2 g/dL (ref 30.0–36.0)
MCV: 98.1 fL (ref 80.0–100.0)
Monocytes Absolute: 1.3 K/uL — ABNORMAL HIGH (ref 0.1–1.0)
Monocytes Relative: 9 %
Neutro Abs: 11.4 K/uL — ABNORMAL HIGH (ref 1.7–7.7)
Neutrophils Relative %: 81 %
Platelets: 283 K/uL (ref 150–400)
RBC: 4.21 MIL/uL (ref 3.87–5.11)
RDW: 14.5 % (ref 11.5–15.5)
WBC: 14.3 K/uL — ABNORMAL HIGH (ref 4.0–10.5)
nRBC: 0 % (ref 0.0–0.2)

## 2023-12-07 LAB — COMPREHENSIVE METABOLIC PANEL WITH GFR
ALT: 17 U/L (ref 0–44)
AST: 36 U/L (ref 15–41)
Albumin: 4.5 g/dL (ref 3.5–5.0)
Alkaline Phosphatase: 94 U/L (ref 38–126)
Anion gap: 16 — ABNORMAL HIGH (ref 5–15)
BUN: 40 mg/dL — ABNORMAL HIGH (ref 8–23)
CO2: 21 mmol/L — ABNORMAL LOW (ref 22–32)
Calcium: 11 mg/dL — ABNORMAL HIGH (ref 8.9–10.3)
Chloride: 102 mmol/L (ref 98–111)
Creatinine, Ser: 2.53 mg/dL — ABNORMAL HIGH (ref 0.44–1.00)
GFR, Estimated: 19 mL/min — ABNORMAL LOW (ref >60)
Glucose, Bld: 121 mg/dL — ABNORMAL HIGH (ref 70–99)
Potassium: 4.5 mmol/L (ref 3.5–5.1)
Sodium: 139 mmol/L (ref 135–145)
Total Bilirubin: 0.6 mg/dL (ref 0.0–1.2)
Total Protein: 7.6 g/dL (ref 6.5–8.1)

## 2023-12-07 MED ORDER — SODIUM CHLORIDE 0.9 % IV BOLUS
500.0000 mL | Freq: Once | INTRAVENOUS | Status: AC
  Administered 2023-12-07: 500 mL via INTRAVENOUS

## 2023-12-07 NOTE — ED Triage Notes (Addendum)
 Pt arrived to ED via [facility] after mechanical fall from standing. Hx of dementia. Pt denies pain; hematoma noted to right forearm by EMS. No obvious injuries or deformity. Hx of CVA.

## 2023-12-07 NOTE — ED Provider Notes (Signed)
 Shared visit   Patient CT scan overall unremarkable with no acute fracture or intracranial hemorrhage.  Have a low suspicion for occult fracture.  Lab work overall reassuring and at her baseline.  Waiting for a urine.  If urine shows findings of urinary tract infection we will start on antibiotics otherwise will be discharged home for close outpatient follow-up.   Suzanne Kirsch, MD 12/07/23 [phone]

## 2023-12-07 NOTE — ED Notes (Signed)
Patient placed on a pure wick at this time.

## 2023-12-07 NOTE — ED Notes (Signed)
 Fall precautions put into place.

## 2023-12-07 NOTE — ED Triage Notes (Signed)
 Unable to get details about why patient is here. Pt has hematoma to R forearm and ace wrap to L ankle. EMS reported mechanical fall. Pt denies specific pain or injury.

## 2023-12-07 NOTE — ED Provider Notes (Signed)
 [facility] Provider Note    Event Date/Time   First MD Initiated Contact with Patient 12/07/23 1308     (approximate)   History   Fall   HPI  Kristina Mitchell is a 78 y.o. female history of hypertension, GERD, IBS, CKD, hyperlipidemia presents emergency department after a fall at home.  Arrived via EMS.  Patient has dementia so most information was gathered from her daughter via phone.  States patient's had several falls.  They are concerned about her safety.  However they are willing to get home health to come to the house versus nursing facility at this time.  The patient has not complained of chest pain, shortness of breath per the daughter.      Physical Exam   Triage Vital Signs: ED Triage Vitals  Encounter Vitals Group     BP 12/07/23 1225 125/67     Girls Systolic BP Percentile --      Girls Diastolic BP Percentile --      Boys Systolic BP Percentile --      Boys Diastolic BP Percentile --      Pulse Rate 12/07/23 1224 (!) 54     Resp 12/07/23 1224 17     Temp 12/07/23 1224 98.1 F (36.7 C)     Temp Source 12/07/23 1224 Oral     SpO2 12/07/23 1200 100 %     Weight --      Height --      Head Circumference --      Peak Flow --      Pain Score --      Pain Loc --      Pain Education --      Exclude from Growth Chart --     Most recent vital signs: Vitals:   12/07/23 1224 12/07/23 1225  BP:  125/67  Pulse: (!) 54   Resp: 17   Temp: 98.1 F (36.7 C)   SpO2: 100%      General: Awake, no distress.   CV:  Good peripheral perfusion.  Resp:  Normal effort. Abd:  No distention.   Other:  C-spine slightly tender, left hip tender, right forearm tender, left ankle tender, full range of motion, neurovascular intact, bruise noted on the right forearm   ED Results / Procedures / Treatments   Labs (all labs ordered are listed, but only abnormal results are displayed) Labs Reviewed  COMPREHENSIVE METABOLIC PANEL WITH GFR - Abnormal;  Notable for the following components:      Result Value   CO2 21 (*)    Glucose, Bld 121 (*)    BUN 40 (*)    Creatinine, Ser 2.53 (*)    Calcium  11.0 (*)    GFR, Estimated 19 (*)    Anion gap 16 (*)    All other components within normal limits  CBC WITH DIFFERENTIAL/PLATELET - Abnormal; Notable for the following components:   WBC 14.3 (*)    Neutro Abs 11.4 (*)    Monocytes Absolute 1.3 (*)    All other components within normal limits  URINALYSIS, ROUTINE W REFLEX MICROSCOPIC - Abnormal; Notable for the following components:   Color, Urine YELLOW (*)    APPearance CLEAR (*)    All other components within normal limits     EKG     RADIOLOGY CT head, C-spine, x-ray left hip, left ankle, and right forearm    PROCEDURES:   Procedures  Critical Care:  no Chief  Complaint  Patient presents with   Fall      MEDICATIONS ORDERED IN ED: Medications  sodium chloride  0.9 % bolus 500 mL (0 mLs Intravenous Stopped 12/07/23 1724)     IMPRESSION / MDM / ASSESSMENT AND PLAN / ED COURSE  I reviewed the triage vital signs and the nursing notes.                              Differential diagnosis includes, but is not limited to, subdural, SAH, CVA, dementia, fracture, contusion, strain, UTI, electrolyte disorder  Patient's presentation is most consistent with acute illness / injury with system symptoms.   Patient was given normal saline 500 mL IV  Labs are basically reassuring, patient's chronic kidney disease appears to be stable, CBC showing a elevated WBC of 14.3, could be due to trauma or could be due to UTI   UA is pending  CT of the head, C-spine independent review and interpretation by me as being negative for acute abnormality  X-ray left hip, left ankle, and right forearm are all independent review and interpretation by me as being negative for acute abnormality  Will await the UA as the daughter had concerns about her increasing dementia recently.  UA  reassuring  Did explain findings to the patient.  Nursing staff to call family.  She is stable I do not see any red flags to warrant admission or further workup.  She is discharged in stable condition.  Family is to come and get the patient.  Follow-up with your regular doctor for home health referrals  FINAL CLINICAL IMPRESSION(S) / ED DIAGNOSES   Final diagnoses:  Fall, initial encounter  Multiple contusions     Rx / DC Orders   ED Discharge Orders     None        Note:  This document was prepared using Dragon voice recognition software and may include unintentional dictation errors.    Gasper Devere ORN, PA-C 12/07/23 ALTO Arlander [place], MD 12/07/23 1910

## 2023-12-07 NOTE — Discharge Instructions (Signed)
 Follow up with your regular doctor, call for an appointment Return if worsening Your xrays and labs are all reassuring, you do not have a fracture or head injury, your labs are stable

## 2023-12-07 NOTE — ED Notes (Signed)
 This RN assisted patient to use the restroom. Patient had a small bowel movement but was unable to provide a urine sample. Patient assisted back to bed without complication.

## 2023-12-08 ENCOUNTER — Emergency Department: Admission: EM | Admit: 2023-12-08 | Discharge: 2023-12-09 | Disposition: A

## 2023-12-08 ENCOUNTER — Emergency Department

## 2023-12-08 DIAGNOSIS — S82455A Nondisplaced comminuted fracture of shaft of left fibula, initial encounter for closed fracture: Secondary | ICD-10-CM | POA: Insufficient documentation

## 2023-12-08 DIAGNOSIS — W06XXXA Fall from bed, initial encounter: Secondary | ICD-10-CM | POA: Insufficient documentation

## 2023-12-08 DIAGNOSIS — R262 Difficulty in walking, not elsewhere classified: Secondary | ICD-10-CM

## 2023-12-08 MED ORDER — ROSUVASTATIN CALCIUM 10 MG PO TABS
10.0000 mg | ORAL_TABLET | Freq: Every day | ORAL | Status: DC
  Administered 2023-12-08 – 2023-12-09 (×2): 10 mg via ORAL
  Filled 2023-12-08 (×2): qty 1

## 2023-12-08 MED ORDER — ACETAMINOPHEN 500 MG PO TABS
500.0000 mg | ORAL_TABLET | Freq: Four times a day (QID) | ORAL | Status: DC | PRN
  Administered 2023-12-09: 500 mg via ORAL
  Filled 2023-12-08: qty 1

## 2023-12-08 NOTE — ED Notes (Signed)
 Pt assisted into the bed. Fall bundle in place. Call bed tied to bed railing. No other needs at this time.

## 2023-12-08 NOTE — Evaluation (Signed)
 Occupational Therapy Evaluation Patient Details Name: Kristina Mitchell MRN: [id number] DOB: Apr 20, 1945 Today's Date: 12/08/2023   History of Present Illness   Pt is an 78 y/o F admitted on 12/07/23 after presenting with c/o a fall. Imaging revealed L fibular shaft fx, ED recommending nonoperative management & split placement. PMH: HTN, GERD, IBS, CKD5, dyslipidemia    Clinical Impressions Kristina Mitchell was seen for OT evaluation this date. Prior to hospital admission, pt was IND. Pt lives alone in 3rd floor apt stair access only. Pt currently requires MIN A + RW for toilet t/f and standing grooming tasks. MOD A to stand from low toilet height. MAX A for LB access in sitting and pericare standing. Pt would benefit from skilled OT to address noted impairments and functional limitations (see below for any additional details). Upon hospital discharge, recommend OT follow up <3 hours/day.    If plan is discharge home, recommend the following:   A lot of help with walking and/or transfers;A lot of help with bathing/dressing/bathroom;Help with stairs or ramp for entrance;Supervision due to cognitive status     Functional Status Assessment   Patient has had a recent decline in their functional status and demonstrates the ability to make significant improvements in function in a reasonable and predictable amount of time.     Equipment Recommendations   BSC/3in1     Recommendations for Other Services         Precautions/Restrictions   Precautions Precautions: Fall Recall of Precautions/Restrictions: Intact Restrictions Weight Bearing Restrictions Per Provider Order: Yes LLE Weight Bearing Per Provider Order: Weight bearing as tolerated (per secure chat with attending)     Mobility Bed Mobility Overal bed mobility: Needs Assistance Bed Mobility: Supine to Sit, Sit to Supine     Supine to sit: Mod assist Sit to supine: Mod assist, +2 for physical assistance         Transfers Overall transfer level: Needs assistance Equipment used: Rolling walker (2 wheels) Transfers: Sit to/from Stand Sit to Stand: Mod assist           General transfer comment: from low toilet height      Balance Overall balance assessment: Needs assistance, History of Falls Sitting-balance support: Feet supported Sitting balance-Leahy Scale: Fair     Standing balance support: During functional activity, No upper extremity supported Standing balance-Leahy Scale: Poor                             ADL either performed or assessed with clinical judgement   ADL Overall ADL's : Needs assistance/impaired                                       General ADL Comments: MIN A + RW for toilet t/f and standing grooming tasks. MAX A for LB access in sitting      Pertinent Vitals/Pain Pain Assessment Pain Assessment: Faces Faces Pain Scale: Hurts little more Pain Location: L LE Pain Descriptors / Indicators: Discomfort, Guarding, Grimacing Pain Intervention(s): Limited activity within patient's tolerance, Repositioned     Extremity/Trunk Assessment Upper Extremity Assessment Upper Extremity Assessment: Generalized weakness RUE Deficits / Details: limited RUE shoulder flexion, pt reports this is chronic   Lower Extremity Assessment Lower Extremity Assessment: LLE deficits/detail LLE Deficits / Details: in splint & ace wrap       Communication Communication Communication: Impaired  Factors Affecting Communication: Hearing impaired   Cognition Arousal: Alert Behavior During Therapy: WFL for tasks assessed/performed Cognition: History of cognitive impairments                               Following commands: Impaired Following commands impaired: Follows one step commands with increased time     Cueing  General Comments   Cueing Techniques: Verbal cues  assisted pt with toileting, hand hygiene at sink   Exercises      Shoulder Instructions      Home Living Family/patient expects to be discharged to:: Private residence Living Arrangements: Alone   Type of Home: Apartment Home Access: Stairs to enter [employer] of Steps: 2 flights (lives in 3rd floor apartment) Entrance Stairs-Rails:  (rail but unsure which side) Home Layout: One level                          Prior Functioning/Environment Prior Level of Function : Patient poor historian/Family not available             Mobility Comments: Pt reports she ambulates with walker      OT Problem List: Decreased strength;Decreased range of motion;Decreased activity tolerance;Impaired balance (sitting and/or standing);Decreased safety awareness   OT Treatment/Interventions: Self-care/ADL training;Therapeutic exercise;Energy conservation;DME and/or AE instruction;Therapeutic activities;Patient/family education;Balance training      OT Goals(Current goals can be found in the care plan section)   Acute Rehab OT Goals Patient Stated Goal: to go home OT Goal Formulation: With patient Time For Goal Achievement: 12/22/23 Potential to Achieve Goals: Good ADL Goals Pt Will Perform Grooming: with modified independence;standing Pt Will Perform Lower Body Dressing: with modified independence;sit to/from stand Pt Will Transfer to Toilet: with modified independence;ambulating;regular height toilet   OT Frequency:  Min 2X/week    Co-evaluation PT/OT/SLP Co-Evaluation/Treatment: Yes Reason for Co-Treatment: For patient/therapist safety PT goals addressed during session: Mobility/safety with mobility;Balance OT goals addressed during session: ADL's and self-care      AM-PAC OT 6 Clicks Daily Activity     Outcome Measure Help from another person eating meals?: None Help from another person taking care of personal grooming?: A Little Help from another person toileting, which includes using toliet, bedpan, or urinal?: A  Lot Help from another person bathing (including washing, rinsing, drying)?: A Lot Help from another person to put on and taking off regular upper body clothing?: A Little Help from another person to put on and taking off regular lower body clothing?: A Lot 6 Click Score: 16   End of Session Equipment Utilized During Treatment: Rolling walker (2 wheels)  Activity Tolerance: Patient tolerated treatment well Patient left: in bed;with call bell/phone within reach;with bed alarm set  OT Visit Diagnosis: Other abnormalities of gait and mobility (R26.89);Muscle weakness (generalized) (M62.81)                Time: 9045-8988 OT Time Calculation (min): 17 min Charges:  OT General Charges $OT Visit: 1 Visit OT Evaluation $OT Eval Low Complexity: 1 Low OT Treatments $Self Care/Home Management : 8-22 mins  Elston Slot, M.S. OTR/L  12/08/23, 1:15 PM  ascom [phone]

## 2023-12-08 NOTE — TOC Initial Note (Signed)
 Transition of Care [facility]) - Initial/Assessment Note    Patient Details  Name: Kristina Mitchell MRN: [id number] Date of Birth: August 20, 1945  Transition of Care [facility]) CM/SW Contact:    Kristina Mitchell L Kristina Akey, LCSW Phone Number: 12/08/2023, 9:08 AM  Clinical Narrative:                  CSW spoke with patient daughter, Kristina Mitchell. CSW and Kristina Mitchell discussed discharge planning for patient. Kristina Mitchell advised that she needs her mother to be placed in a SNF if she qualifies. She advised that she has physical issues that prevent her from caring from her mother at home.   Kristina Mitchell lives in [place] and the plan was to move her mother to [place]. She stated that her father passed away 5 years ago and her mother is all she has. She stated that she has a sister that can help provide care however, she too is experiencing physical health issues that would make care giving unsafe.   CSW and Kristina Mitchell discussed choice for facilities. Kristina Mitchell advised that she is on her way to speak with her mothers PCP. She stated that she is not from this area and she is unfamiliar with services offered. CSW offered to e-mail a list of facilities in [place], for Kristina Mitchell to review. CSW e-mailed the list to [email or web address].   Kristina Mitchell advised that she would like bed searches in [place].        Patient Goals and CMS Choice            Expected Discharge Plan and Services                                              Prior Living Arrangements/Services                       Activities of Daily Living      Permission Sought/Granted                  Emotional Assessment              Admission diagnosis:  Fall Patient Active Problem List   Diagnosis Date Noted   Acute respiratory failure with hypoxia (HCC) 05/01/2021   CAP (community acquired pneumonia) 04/30/2021   CKD (chronic kidney disease) stage 5, GFR less than 15 ml/min (HCC) 04/30/2021   COVID-19 virus infection 04/30/2021    Weakness 04/30/2021   Elevated troponin 04/30/2021   AKI (acute kidney injury) 04/05/2021   CKD (chronic kidney disease) stage 3, GFR 30-59 ml/min (HCC) 04/05/2021   GERD (gastroesophageal reflux disease)    Anemia due to chronic kidney disease    Left flank pain, chronic 02/05/2015   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 [place] [address] Phone: [phone] Fax: [phone]     Social Drivers of Health (SDOH) Social History: SDOH Screenings   Tobacco Use:  Medium Risk (12/08/2023)   SDOH Interventions:     Readmission Risk Interventions     No data to display

## 2023-12-08 NOTE — TOC Progression Note (Addendum)
 Transition of Care [facility]) - Progression Note    Patient Details  Name: Kristina Mitchell MRN: [id number] Date of Birth: 09-21-1945  Transition of Care [facility]) CM/SW Contact  Maziah Keeling L Wyley Hack, [place] Phone Number: 12/08/2023, 2:40 PM  Clinical Narrative:     FL2 and bed searches completed. Patient has traditional Medicare and is not being admitted to inpatient floor.   CSW spoke with [facility] and Rehab to obtain the daily rate for out of pocket to advise patients daughter.   CSW contacted daughter to advise that patient cannot go to SNF from ED unless she is paying out of pocket. Daughter advised that she has the ability to pay. She stated that she does not understand because staff in the ED assured her that her mother would be placed. CSW reviewed Medicare guidelines and explained that patient is in the ED and she is not inpatient. Daughter advised that she does not understand because her mother is weak. CSW continued to advise of Medicare guidelines.    CSW emailed list of facilities that [facility] was sent to for her to follow up. Daughter stated that she has POA over patient finances. She was visibly upset because she advised that patient cannot be home and she is physically unable to care for her mother due to her physical inabilities. CSW provided information for private duty home care.                     Expected Discharge Plan and Services                                               Social Drivers of Health (SDOH) Interventions SDOH Screenings   Tobacco Use: Medium Risk (12/08/2023)    Readmission Risk Interventions     No data to display

## 2023-12-08 NOTE — Evaluation (Signed)
 Physical Therapy Evaluation Patient Details Name: Kristina Mitchell MRN: [id number] DOB: 04-06-1945 Today's Date: 12/08/2023  History of Present Illness  Pt is an 78 y/o F admitted on 12/07/23 after presenting with c/o a fall. Imaging revealed L fibular shaft fx, ED recommending nonoperative management & split placement. PMH: HTN, GERD, IBS, CKD5, dyslipidemia  Clinical Impression  Pt seen for PT evaluation with pt agreeable to tx. Pt is pleasantly confused throughout session. Pt requires +2 for bed mobility as pt is limited 2/2 short height & elevated ED stretcher height. Pt ambulates in room with RW & min assist +2, cuing throughout. Recommend ongoing PT services to progress mobility as able. Pt would benefit from post acute rehab <3 hours therapy/day upon d/c. Pt is a high fall risk & unsafe to d/c home alone.        If plan is discharge home, recommend the following: A lot of help with walking and/or transfers;A lot of help with bathing/dressing/bathroom;Assistance with cooking/housework;Help with stairs or ramp for entrance;Assist for transportation;Direct supervision/assist for medications management;Direct supervision/assist for financial management;Supervision due to cognitive status   Can travel by private vehicle   Yes    Equipment Recommendations Other (comment) (defer to next venue)  Recommendations for Other Services  Rehab consult    Functional Status Assessment Patient has had a recent decline in their functional status and demonstrates the ability to make significant improvements in function in a reasonable and predictable amount of time.     Precautions / Restrictions Precautions Precautions: Fall Restrictions Weight Bearing Restrictions Per Provider Order: Yes LLE Weight Bearing Per Provider Order: Weight bearing as tolerated (per secure chat with attending)      Mobility  Bed Mobility Overal bed mobility: Needs Assistance Bed Mobility: Supine to Sit, Sit to  Supine     Supine to sit: Mod assist, +2 for physical assistance, +2 for safety/equipment Sit to supine: Min assist, +2 for physical assistance, +2 for safety/equipment   General bed mobility comments: limited 2/2 short height & elevated height of ED stretcher    Transfers Overall transfer level: Needs assistance Equipment used: Rolling walker (2 wheels) Transfers: Sit to/from Stand Sit to Stand: Min assist, Mod assist           General transfer comment: sit>stand from elevated EOB with min assist, mod assist from low toilet    Ambulation/Gait Ambulation/Gait assistance: Min assist, +2 safety/equipment Gait Distance (Feet): 15 Feet (+ 7 ft) Assistive device: Rolling walker (2 wheels) Gait Pattern/deviations: Decreased step length - left, Decreased step length - right, Decreased stride length, Decreased dorsiflexion - right, Decreased dorsiflexion - left Gait velocity: decreased     General Gait Details: cuing to ambulate within base of AD, assistance for RW management at times  Stairs            Wheelchair Mobility     Tilt Bed    Modified Rankin (Stroke Patients Only)       Balance Overall balance assessment: Needs assistance, History of Falls Sitting-balance support: Feet supported Sitting balance-Leahy Scale: Fair     Standing balance support: During functional activity, Bilateral upper extremity supported, Reliant on assistive device for balance Standing balance-Leahy Scale: Poor                               Pertinent Vitals/Pain Pain Assessment Pain Assessment: Faces Faces Pain Scale: Hurts little more Pain Location: L LE Pain Descriptors / Indicators:  Discomfort, Guarding, Grimacing Pain Intervention(s): Monitored during session, Limited activity within patient's tolerance    Home Living Family/patient expects to be discharged to:: Private residence Living Arrangements: Alone   Type of Home: Apartment Home Access: Stairs to  enter Entrance Stairs-Rails:  (rail but unsure which side) Entrance Stairs-Number of Steps: 2 flights (lives in 3rd floor apartment)   Home Layout: One level        Prior Function Prior Level of Function : Patient poor historian/Family not available             Mobility Comments: Pt reports she ambulates with walker       Extremity/Trunk Assessment   Upper Extremity Assessment Upper Extremity Assessment: Generalized weakness;RUE deficits/detail RUE Deficits / Details: limited RUE shoulder flexion, pt reports this is chronic    Lower Extremity Assessment Lower Extremity Assessment: LLE deficits/detail;Generalized weakness LLE Deficits / Details: in splint & ace wrap       Communication        Cognition Arousal: Alert Behavior During Therapy: WFL for tasks assessed/performed   PT - Cognitive impairments: History of cognitive impairments, Orientation, Memory, Awareness, Attention, Initiation, Sequencing, Problem solving, Safety/Judgement   Orientation impairments: Place, Time, Situation (oriented to name but not correct age)                     Following commands: Impaired Following commands impaired: Follows one step commands with increased time     Cueing Cueing Techniques: Verbal cues     General Comments General comments (skin integrity, edema, etc.): assisted pt with toileting, hand hygiene at sink    Exercises     Assessment/Plan    PT Assessment Patient needs continued PT services  PT Problem List Decreased strength;Pain;Decreased range of motion;Decreased cognition;Decreased activity tolerance;Decreased knowledge of use of DME;Decreased safety awareness;Decreased mobility;Decreased knowledge of precautions;Decreased balance       PT Treatment Interventions DME instruction;Balance training;Gait training;Neuromuscular re-education;Stair training;Cognitive remediation;Functional mobility training;Patient/family education;Wheelchair mobility  training;Therapeutic activities;Therapeutic exercise;Manual techniques    PT Goals (Current goals can be found in the Care Plan section)  Acute Rehab PT Goals Patient Stated Goal: none stated PT Goal Formulation: With patient Time For Goal Achievement: 12/22/23 Potential to Achieve Goals: Good    Frequency Min 2X/week     Co-evaluation PT/OT/SLP Co-Evaluation/Treatment: Yes Reason for Co-Treatment: For patient/therapist safety PT goals addressed during session: Mobility/safety with mobility;Balance         AM-PAC PT 6 Clicks Mobility  Outcome Measure Help needed turning from your back to your side while in a flat bed without using bedrails?: A Little Help needed moving from lying on your back to sitting on the side of a flat bed without using bedrails?: A Lot Help needed moving to and from a bed to a chair (including a wheelchair)?: A Lot Help needed standing up from a chair using your arms (e.g., wheelchair or bedside chair)?: A Lot Help needed to walk in hospital room?: A Lot Help needed climbing 3-5 steps with a railing? : A Lot 6 Click Score: 13    End of Session   Activity Tolerance: Patient tolerated treatment well Patient left: in bed;with call bell/phone within reach;with bed alarm set Nurse Communication: Mobility status;Weight bearing status PT Visit Diagnosis: Unsteadiness on feet (R26.81);History of falling (Z91.81);Muscle weakness (generalized) (M62.81);Other abnormalities of gait and mobility (R26.89);Pain;Difficulty in walking, not elsewhere classified (R26.2) Pain - Right/Left: Left Pain - part of body: Ankle and joints of foot  Time: 9047-8988 PT Time Calculation (min) (ACUTE ONLY): 19 min   Charges:   PT Evaluation $PT Eval Low Complexity: 1 Low   PT General Charges $$ ACUTE PT VISIT: 1 Visit         Richerd Pinal, PT, DPT 12/08/23, 10:25 AM   Richerd CHRISTELLA Pinal 12/08/2023, 10:24 AM

## 2023-12-08 NOTE — NC FL2 (Signed)
 [place]  MEDICAID FL2 LEVEL OF CARE FORM     IDENTIFICATION  Patient Name: MAIYA KATES Birthdate: 1945/06/21 Sex: female Admission Date (Current Location): 12/08/2023  [facility] and [place] Number:  Chiropodist and Address:  [facility], [address] [place], [place] [address]      Provider Number: [id number]  Attending Physician Name and Address:  Levander Slate, MD  Relative Name and Phone Number:  Teresita, Fanton (Daughter)  [phone] (Mobile)    Current Level of Care: Hospital Recommended Level of Care: Skilled Nursing Facility Prior Approval Number:    Date Approved/Denied:   PASRR Number:    Discharge Plan: SNF    Current Diagnoses: Patient Active Problem List   Diagnosis Date Noted   Acute respiratory failure with hypoxia (HCC) 05/01/2021   CAP (community acquired pneumonia) 04/30/2021   CKD (chronic kidney disease) stage 5, GFR less than 15 ml/min (HCC) 04/30/2021   COVID-19 virus infection 04/30/2021   Weakness 04/30/2021   Elevated troponin 04/30/2021   AKI (acute kidney injury) 04/05/2021   CKD (chronic kidney disease) stage 3, GFR 30-59 ml/min (HCC) 04/05/2021   GERD (gastroesophageal reflux disease)    Anemia due to chronic kidney disease    Left flank pain, chronic 02/05/2015   Cerebral thrombosis with cerebral infarction 10/07/2014   Double vision with both eyes open 10/06/2014   Special screening for malignant neoplasms, colon    Benign neoplasm of ascending colon    Benign neoplasm of descending colon    Benign neoplasm of sigmoid colon    Hypertension 09/12/2014   Hyperlipidemia 09/12/2014   IBS (irritable bowel syndrome) 09/12/2014    Orientation RESPIRATION BLADDER Height & Weight     Self, Time, Situation, Place  Normal External catheter Weight: 179 lb (81.2 kg) Height:     BEHAVIORAL SYMPTOMS/MOOD NEUROLOGICAL BOWEL NUTRITION STATUS     (Dementia) Continent Diet  AMBULATORY STATUS COMMUNICATION  OF NEEDS Skin   Limited Assist Verbally Normal                       Personal Care Assistance Level of Assistance  Bathing, Dressing Bathing Assistance: Limited assistance   Dressing Assistance: Limited assistance     Functional Limitations Info             SPECIAL CARE FACTORS FREQUENCY  PT (By licensed PT), OT (By licensed OT)     PT Frequency: 2x OT Frequency: 2x            Contractures Contractures Info: Not present    Additional Factors Info  Allergies   Allergies Info: Sulfa Antibiotics; Benazepril            Current Medications (12/08/2023):  This is the current hospital active medication list Current Facility-Administered Medications  Medication Dose Route Frequency Provider Last Rate Last Admin   acetaminophen  (TYLENOL ) tablet 500 mg  500 mg Oral Q6H PRN Fernand Rossie HERO, MD       rosuvastatin  (CRESTOR ) tablet 10 mg  10 mg Oral Daily Fernand Rossie HERO, MD   10 mg at 12/08/23 1247   Current Outpatient Medications  Medication Sig Dispense Refill   acetaminophen  (TYLENOL ) 500 MG tablet Take 500 mg by mouth every 6 (six) hours as needed for fever, headache, moderate pain or mild pain.     amLODipine  (NORVASC ) 10 MG tablet Take 10 mg by mouth every morning.     aspirin  EC 81 MG tablet Take 81 mg by mouth  daily. Swallow whole.     losartan (COZAAR) 25 MG tablet Take 25 mg by mouth daily.     nadolol  (CORGARD ) 40 MG tablet Take 40 mg by mouth 2 (two) times daily.   3   rosuvastatin  (CRESTOR ) 10 MG tablet Take 10 mg by mouth daily.     sodium bicarbonate  650 MG tablet Take 1 tablet (650 mg total) by mouth 2 (two) times daily. 60 tablet 0   azithromycin  (ZITHROMAX ) 250 MG tablet One tab po nightly for three days (Patient not taking: Reported on 12/08/2023) 3 each 0   Cholecalciferol  50 MCG (2000 UT) CAPS Take by mouth. (Patient not taking: Reported on 12/08/2023)       Discharge Medications: Please see discharge summary for a list of discharge  medications.  Relevant Imaging Results:  Relevant Lab Results:   Additional Information [id number]  Jalayla Chrismer L Calistro Rauf, LCSW

## 2023-12-08 NOTE — ED Provider Notes (Signed)
 [facility] Provider Note    Event Date/Time   First MD Initiated Contact with Patient 12/08/23 [phone]     (approximate)   History   Fall   HPI  Kristina Mitchell is a 78 y.o. female who presents today with concern of a fall.  Apparently a slow controlled fall out of bed.  Patient tells me that she was reaching over to pick something up for her dog when she fell denies hitting her head and only complaining of pain along her hip and her lower leg.  EMS who brought her in to inform me that they have been called multiple times to same home for similar complaints.  Unfortunately patient lives on the top story of an apartment complex and is not able to get up or down the stairs.  She was just in our facility recently it seems I reviewed her charts, there was discussion of possible placement for nursing home, however seems at that time daughter inclined to have the patient return back home with home health.  Today it seems that the daughter would like the patient to be placed at a nursing facility.  Patient without complaints of head neck or other areas of pain.     Physical Exam   Triage Vital Signs: ED Triage Vitals  Encounter Vitals Group     BP --      Girls Systolic BP Percentile --      Girls Diastolic BP Percentile --      Boys Systolic BP Percentile --      Boys Diastolic BP Percentile --      Pulse Rate 12/08/23 0558 (!) 54     Resp 12/08/23 0558 16     Temp 12/08/23 0558 97.6 F (36.4 C)     Temp src --      SpO2 12/08/23 0556 98 %     Weight 12/08/23 0557 179 lb (81.2 kg)     Height --      Head Circumference --      Peak Flow --      Pain Score --      Pain Loc --      Pain Education --      Exclude from Growth Chart --     Most recent vital signs: Vitals:   12/08/23 0558 12/08/23 0630  BP:  132/63  Pulse: (!) 54 (!) 56  Resp: 16 15  Temp: 97.6 F (36.4 C)   SpO2: 100% 100%     General: Awake, no distress.  CV:  Good peripheral  perfusion.  Resp:  Normal effort.  Abd:  No distention.  MSK:  I do not appreciate any tenderness along the cervical thoracic or lumbar spine, extremities are nontender to palpation aside from the left hip which has pain to palpation and the left lower leg which appears to have pain along the lateral aspect of the leg.  I do not appreciate any obvious trauma.  And the extremities are vascularly intact Other:     ED Results / Procedures / Treatments   Labs (all labs ordered are listed, but only abnormal results are displayed) Labs Reviewed - No data to display   EKG     RADIOLOGY  On my independent interpretation of the x-ray of the lower leg appears to have a midshaft fibular fracture that is well aligned  PROCEDURES:  Critical Care performed: No  Procedures   MEDICATIONS ORDERED IN ED: Medications  rosuvastatin  (CRESTOR )  tablet 10 mg (has no administration in time range)  acetaminophen  (TYLENOL ) tablet 500 mg (has no administration in time range)     IMPRESSION / MDM / ASSESSMENT AND PLAN / ED COURSE  I reviewed the triage vital signs and the nursing notes.                               Patient's presentation is most consistent with acute complicated illness / injury requiring diagnostic workup.  78 year old female who presents today with concern of a fall.  Denies any head trauma, she seems to have good memory of the event, and is able to recall events leading up to this as well as events prior.  Denying trauma to the head.  Given the clinical exam obtaining images of the areas of pain.  Ultimately patient will likely warrant long-term placement given her presentation.    I reviewed x-ray imaging which reveals a fibular shaft fracture.  Fortunately these are generally nonoperative fractures, will place the leg in a sugar-tong splint, will have patient evaluated by social work determine ultimate disposition and placement.  Definitive fracture care provided.        FINAL CLINICAL IMPRESSION(S) / ED DIAGNOSES   Final diagnoses:  Closed nondisplaced comminuted fracture of shaft of left fibula, initial encounter  Ambulatory dysfunction     Rx / DC Orders   ED Discharge Orders     None        Note:  This document was prepared using Dragon voice recognition software and may include unintentional dictation errors.   Fernand Rossie HERO, MD 12/08/23 [phone]

## 2023-12-08 NOTE — ED Notes (Signed)
 Answered call light, assisted pt to the toilet, advised to ring the call light when she is finished.

## 2023-12-08 NOTE — ED Notes (Signed)
 Pt assisted from the toilet back to recliner. Call bell tied to walker at side of recliner.

## 2023-12-08 NOTE — ED Notes (Signed)
 Pt was assisted to and from the bathroom in the room. Was able to ambulate with walker. Unable to make it back to bed d/t leg pain. Recliner brought to pt.  Pt sitting up on recliner. Sitting up eating dinner. Has call bell within reach. Denies any additional needs.

## 2023-12-08 NOTE — ED Triage Notes (Addendum)
 Pt to ED BIB [facility] from home with c/o left lower leg pain s/p fall out of bed this morning. Did not hit head, -LOC. Per EMS, they have been to pt's resides 4 times this week for the same complaint as pt does not like to use her walker. Pt lives with her daughter who expressed her desire for pt to be placed in a nursing home. P

## 2023-12-09 DIAGNOSIS — S82455A Nondisplaced comminuted fracture of shaft of left fibula, initial encounter for closed fracture: Secondary | ICD-10-CM | POA: Diagnosis not present

## 2023-12-09 MED ORDER — ACETAMINOPHEN 500 MG PO TABS: 1000.0000 mg | ORAL_TABLET | Freq: Four times a day (QID) | ORAL | Status: DC | PRN

## 2023-12-09 NOTE — TOC Progression Note (Signed)
 Transition of Care [facility]) - Progression Note    Patient Details  Name: Kristina Mitchell MRN: [id number] Date of Birth: 07-25-1945  [facility] CM/SW Contact  Sakshi Sermons L Kiari Hosmer, [place] Phone Number: 12/09/2023, 9:11 AM  Clinical Narrative:     Follow up call with Kristina Mitchell, patients daughter. Kristina Mitchell provided an update regarding placement. She stated that she called [facility] and discussed placement. She advised that there is bed availability especially for private pay.   Next steps discussed. Kristina Mitchell advised that she has to get into contact with the financial advisor to get approval and a check drafted for payment. Kristina is awaiting a response but advised that the process can take up to 3 days. She stated that she has been coordinating this plan since she and CSW spoke yesterday.   Kristina Mitchell agreed to update CSW today by 4:00pm.                     Expected Discharge Plan and Services                                               Social Drivers of Health (SDOH) Interventions SDOH Screenings   Tobacco Use: Medium Risk (12/08/2023)    Readmission Risk Interventions     No data to display

## 2023-12-09 NOTE — ED Notes (Signed)
 Patient ambulatory with walker to the restroom. Patient returned to bed without complication. Patient's bed alarm activated and bed in lowest position. Patient given ice water .

## 2023-12-09 NOTE — ED Notes (Signed)
 Pt up to restroom and assisted back into bed by this RN. Bed alarm on and call bell on bed rail.

## 2023-12-09 NOTE — ED Notes (Signed)
 PT up to restroom and assisted back to bed. Bed alarm on. No other needs at this time.

## 2023-12-09 NOTE — ED Notes (Signed)
 Pt up to restroom and back into bed with assistance of this RN.

## 2023-12-09 NOTE — Discharge Instructions (Addendum)
 [email or web address]    All [facility] Senior Care Advisory services are available at no cost to you and come with our promise to help you find the best senior care options for your needs.  Our senior advisory services extend peace of mind and support to the families of older adults, ensuring they are well-informed and comfortable with the care decisions made.  Private Care Duty  [facility] [phone]  Home Health Services has been arranged and set up with [facility]. You will receive PT, OT, RN and an AIDE. Services will begin 12/10/2023.   Durable Medical Equipment re: bedside commode and wheelchair ordered through [employer] for home delivery.    [facility] Nurse Role-Congregational Nursing Referral   Offer one-on-one health support Help with basic needs like transportation, housing, food, and medicine Connect people with doctors and hospitals Guide patients through the health care system Teach ways to stay healthy and manage illnesses Support overall well-being, including mental and physical health Check blood pressure, blood sugar, and other key health signs Help prevent and manage high blood pressure and diabetes Organize free health check-ups and flu shot clinics Help people find a regular doctor Reduce unnecessary ER visits Prevent repeat hospital stays
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
## 2023-12-09 NOTE — ED Notes (Signed)
 Pt transferred to hospital bed for pt's comfort.

## 2023-12-09 NOTE — ED Notes (Signed)
 Pt to restroom. Pt had a small loose BM with some bright red blood in the toilet bowl and on the TP when pt wiped.

## 2023-12-09 NOTE — Progress Notes (Signed)
 Patient is not able to walk the distance required to go the bathroom, or he/she is unable to safely negotiate stairs required to access the bathroom.  A 3in1 BSC will alleviate this problem

## 2023-12-09 NOTE — ED Provider Notes (Addendum)
 Working with social work, able to arrange home health PT, OT, RN and aide to come to the house as early as tomorrow and DME equipment with wheelchair and bedside commode to be delivered later today planning for discharge this afternoon.   I update the patient of this and she is agreeable.  She has been up and ambulatory with a walker with her left leg splinted, bearing weight on it.  She lives in a apartment, third floor, stairs without an elevator to get up to it.  Daughter lives next-door on the same floor.  Patient is comfortable going home with this assistance prior to placement on Tuesday, arranged by daughter.  Acknowledging that she will be able to go up the steps slowly with some assistance from family.  I called patient's daughter, Kristina Mitchell, on the phone to see if he can help arrange a ride and to facilitate this discharge plan.  She seems quite frustrated and difficult to converse with, frequently cutting me off and not allowing me to speak.  Has some frustrations that she has not had more consistent communications with the medical staff while her mother has been boarding over the past 34 hours.  She refuses to pick up the patient and help bring her home despite us  arranging the above assistance at home.  She demands that we arrange 24-hour caregivers at home.  She put me on a prolonged hold and apparently gets in touch with patient's nephrologist Dr. Marcelino, now saying that she refuses to pick up the patient until Dr. Marcelino signs off on this plan.   I patiently attempted to educate Kristina Mitchell on the appropriate emergency room use.  We discussed patient has been ambulatory with a walker, bearing weight on her leg without significant difficulty and her own desire to come home.  We discussed that patient does not have any acute nephrology needs.  We discussed inability to arrange 24-hour caregivers at home from the ER.   Despite this, Kristina Mitchell becomes increasingly agitated, yelling at me and  accusing me of not caring about the patient.  She threatens to call the state on me and the emergency room.  She makes various passive-aggressive comments and hangs up on me.   Kristina Rover, MD 12/09/23 1630  Patient later arrives in the ED and agrees to take her mother home.  We reinforced home health and DME that we have ordered, care at home, ED return precautions.  While there is some significant yelling in the room when Kristina Mitchell first arrives, after we help bring the patient out to the car and load her in the vehicle daughter does eventually apologize and indicates that she has a lot going on.    Kristina Rover, MD 12/09/23 [phone]

## 2023-12-09 NOTE — ED Notes (Signed)
 At this time, this EDT, helped ambulate this pt to the BR in the room. Pt was not able to urinate or have a BM. This EDT then helped pt ambulate back to bed with a walker. Bed alarm on and call light within reach.
